# Patient Record
Sex: Male | Born: 1950
Health system: Southern US, Community
[De-identification: ages and names within clinical notes are randomized; demographics above are authoritative.]

## PROBLEM LIST (undated history)

## (undated) DIAGNOSIS — E119 Type 2 diabetes mellitus without complications: Secondary | ICD-10-CM

## (undated) DIAGNOSIS — Z720 Tobacco use: Secondary | ICD-10-CM

## (undated) DIAGNOSIS — F32A Depression, unspecified: Secondary | ICD-10-CM

## (undated) DIAGNOSIS — Z9861 Coronary angioplasty status: Principal | ICD-10-CM

## (undated) DIAGNOSIS — M1990 Unspecified osteoarthritis, unspecified site: Secondary | ICD-10-CM

## (undated) DIAGNOSIS — F329 Major depressive disorder, single episode, unspecified: Secondary | ICD-10-CM

## (undated) DIAGNOSIS — I200 Unstable angina: Secondary | ICD-10-CM

## (undated) DIAGNOSIS — I2510 Atherosclerotic heart disease of native coronary artery without angina pectoris: Principal | ICD-10-CM

## (undated) HISTORY — DX: Depression, unspecified: F32.A

## (undated) HISTORY — DX: Atherosclerotic heart disease of native coronary artery without angina pectoris: I25.10

## (undated) HISTORY — PX: INGUINAL HERNIA REPAIR: SUR1180

## (undated) HISTORY — DX: Coronary angioplasty status: Z98.61

## (undated) HISTORY — DX: Unstable angina: I20.0

## (undated) HISTORY — DX: Type 2 diabetes mellitus without complications: E11.9

## (undated) HISTORY — DX: Major depressive disorder, single episode, unspecified: F32.9

---

## 1978-12-28 HISTORY — PX: EXCISIONAL HEMORRHOIDECTOMY: SHX1541

## 2001-08-12 ENCOUNTER — Inpatient Hospital Stay (HOSPITAL_COMMUNITY): Admission: EM | Admit: 2001-08-12 | Discharge: 2001-08-16 | Payer: Self-pay | Admitting: Psychiatry

## 2009-01-16 ENCOUNTER — Ambulatory Visit (HOSPITAL_COMMUNITY): Admission: AD | Admit: 2009-01-16 | Discharge: 2009-01-17 | Payer: Self-pay | Admitting: General Surgery

## 2010-08-02 LAB — BASIC METABOLIC PANEL
CO2: 29 mEq/L (ref 19–32)
Chloride: 103 mEq/L (ref 96–112)
Creatinine, Ser: 0.99 mg/dL (ref 0.4–1.5)
GFR calc Af Amer: >60 mL/min (ref >60)
Potassium: 4.2 mEq/L (ref 3.5–5.1)
Sodium: 140 mEq/L (ref 135–145)

## 2010-08-02 LAB — CBC
HCT: 45 % (ref 39.0–52.0)
Hemoglobin: 15.1 g/dL (ref 13.0–17.0)
MCHC: 33.7 g/dL (ref 30.0–36.0)
MCV: 95.4 fL (ref 78.0–100.0)
RBC: 4.72 MIL/uL (ref 4.22–5.81)
WBC: 9.8 10*3/uL (ref 4.0–10.5)

## 2010-09-13 NOTE — Discharge Summary (Signed)
NAME:  Tony Lynch, Tony Lynch                          ACCOUNT NO.:  [id number]   MEDICAL RECORD NO.:  [id number]                  PATIENT TYPE:   LOCATION:                                       FACILITY:   PHYSICIAN:  [facility]                                  DATE OF BIRTH:   DATE OF ADMISSION:  DATE OF DISCHARGE:                                 DISCHARGE SUMMARY   HISTORY OF PRESENT ILLNESS:  The patient is a 60 year old white male who was  admitted voluntarily to mental health inpatient unit after overdose on  benzodiazepines.  The patient was taken to the emergency room after he  called EMS.  He denied previous suicidal behavior.  He felt that his actions  were impulsive and related to medical conflicts.   MEDICAL PROBLEMS:  The patient denies at the time of admission.  He denies  any previous history of depression.   HOSPITAL COURSE:  While in the hospital, patient was initially seen by Dr.  Kathrynn Running.  Later in oncall situation was followed by Dr. Milford Cage.  I  also had the chance to see patient on August 16, 2001 in absence of Dr.  Kathrynn Running.  On August 16, 2001, he denied suicidal ideation, felt that he will  try to work out things with his wife but also felt that he could be in peace  if wife decided to divorce him.  He felt that he has good sustaining support  from his children and sisters.  He denies any suicidal ideation or history  of suicidal behavior.   MENTAL STATUS EXAM:  Bright affect.  Good insight and judgment.  Normal  mood.  Absence of psychosis.  Alert, oriented x 3.  Good memory.  The  patient wanted to go home and family was supporting the notion of discharge.  He was able to contract for safety.   MEDICAL PROBLEMS:  During the brief hospital stay, patient did not have any  medical problems.   LABORATORY DATA:  Blood work, done on the unit, showed borderline elevation  of T3 38.9 with low TSH 0.19 for further observation.  Urinalysis was  normal.  CBC was  normal.   PHYSICAL EXAMINATION:  Vital signs, throughout the hospitalization, were  normal with blood pressure 120/67, normal pulse, respiration rate and  temperature.   DISCHARGE DIAGNOSES:   AXIS I:  Adjustment disorder with depressed mood, improved.   AXIS II:  No diagnosis.   AXIS III:  Status post overdose on benzodiazepines.   AXIS IV:  Moderate stressors (marital difficulties).   AXIS V:  Global Assessment of Functioning upon admission 50; for past year  maximum 78; upon discharge 70.   DISCHARGE MEDICATIONS:  The patient was not interested in starting on any  antidepressant medication.   FOLLOW UP:  He  promised to come to the emergency room if any exacerbation of  symptoms.  The patient knows Dr. Jennelle Human and he promised to call if he needed  psychiatric help.  In the meantime, he agrees for appointment with  counselor, Abel Presto, on August 24, 2001 at 10 a.m.   CONDITION ON DISCHARGE:  He was discharged in good condition home and  discharged in care of his family.     Hipolito Bayley, MD                       [facility]    JS/MEDQ  D:  12/22/2001  T:  12/25/2001  Job:  [phone]

## 2010-09-13 NOTE — Discharge Summary (Signed)
[facility]  Patient:    Tony Lynch, Tony Lynch Visit Number: [id number] MRN: [id number]          Service Type: PSY Location: 300 [id number] [id number] Attending Physician:  Jeanice Lim Dictated by:   Jeanice Lim, M.D. Admit Date:  08/12/2001 Discharge Date: 08/16/2001                             Discharge Summary  NO DICTATION.  Dr. Farrel Gordon dictation. Dictated by:   Jeanice Lim, M.D. Attending Physician:  Jeanice Lim DD:  10/06/01 TD:  10/10/01 Job: [id number]

## 2011-03-07 ENCOUNTER — Ambulatory Visit: Payer: Self-pay

## 2011-07-28 ENCOUNTER — Ambulatory Visit: Payer: Self-pay

## 2011-07-28 ENCOUNTER — Other Ambulatory Visit: Payer: Self-pay | Admitting: Lab

## 2012-04-15 ENCOUNTER — Other Ambulatory Visit: Payer: Self-pay | Admitting: Lab

## 2012-04-15 ENCOUNTER — Ambulatory Visit: Payer: Self-pay

## 2013-05-05 ENCOUNTER — Ambulatory Visit: Payer: No Typology Code available for payment source | Attending: Internal Medicine | Admitting: Internal Medicine

## 2013-05-05 ENCOUNTER — Other Ambulatory Visit: Payer: Self-pay | Admitting: Internal Medicine

## 2013-05-05 VITALS — BP 150/79 | HR 66 | Temp 98.7°F | Resp 14 | Ht 73.0 in | Wt 185.2 lb

## 2013-05-05 DIAGNOSIS — F3289 Other specified depressive episodes: Secondary | ICD-10-CM | POA: Insufficient documentation

## 2013-05-05 DIAGNOSIS — M545 Low back pain, unspecified: Secondary | ICD-10-CM | POA: Insufficient documentation

## 2013-05-05 DIAGNOSIS — F1911 Other psychoactive substance abuse, in remission: Secondary | ICD-10-CM

## 2013-05-05 DIAGNOSIS — M25559 Pain in unspecified hip: Secondary | ICD-10-CM | POA: Insufficient documentation

## 2013-05-05 DIAGNOSIS — F329 Major depressive disorder, single episode, unspecified: Secondary | ICD-10-CM | POA: Insufficient documentation

## 2013-05-05 DIAGNOSIS — M25519 Pain in unspecified shoulder: Secondary | ICD-10-CM | POA: Insufficient documentation

## 2013-05-05 LAB — POCT GLYCOSYLATED HEMOGLOBIN (HGB A1C): Hemoglobin A1C: 7.1

## 2013-05-05 MED ORDER — CYCLOBENZAPRINE HCL 5 MG PO TABS: 5.0000 mg | ORAL_TABLET | Freq: Three times a day (TID) | ORAL | Status: DC | PRN

## 2013-05-05 MED ORDER — ESCITALOPRAM OXALATE 10 MG PO TABS: 10.0000 mg | ORAL_TABLET | Freq: Every day | ORAL | Status: DC

## 2013-05-05 MED ORDER — TRAMADOL HCL 50 MG PO TABS: 50.0000 mg | ORAL_TABLET | Freq: Three times a day (TID) | ORAL | Status: DC | PRN

## 2013-05-05 NOTE — Progress Notes (Signed)
Pt is here to establish care. Had a fall last year. Fell on Lt side and hip,ribs, and shoulders. Hard to sleep.

## 2013-05-05 NOTE — Progress Notes (Signed)
Patient ID: Tony Lynch, male   DOB: August 21, 1950, 63 y.o.   MRN: [id number]   CC:  HPI: 63 year old male presents to the clinic to establish care. His main complaint is pain in his left shoulder, left hip, left rib, lumbar spine. The patient's left-sided pain was exacerbated after falling on the ice last year. The patient has not had any imaging studies done. He has been taking over-the-counter Tylenol and BC powder. He continues to experience lumbar spinal pain for the last 15 years. He states that he is ruptured disc. Had seen Dr. Andria Meuse neurosurgeon 15 years ago. The patient also admits to being depressed and was on Lexapro at one point  Social history denies any suicidal ideation today Smokes 1 pack a day Denies use of alcohol  Family history Reviewed and negative  Not on File Past Medical History  Diagnosis Date  . Depression    No current outpatient prescriptions on file prior to visit.   No current facility-administered medications on file prior to visit.   Family History  Problem Relation Age of Onset  . Diabetes Sister   . Heart disease Sister    History   Social History  . Marital Status: Single    Spouse Name: N/A    Number of Children: N/A  . Years of Education: N/A   Occupational History  . Not on file.   Social History Main Topics  . Smoking status: Current Some Day Smoker    Types: Cigarettes  . Smokeless tobacco: Not on file  . Alcohol Use: No  . Drug Use: No  . Sexual Activity: Not on file   Other Topics Concern  . Not on file   Social History Narrative  . No narrative on file    Review of Systems  Constitutional: Negative for fever, chills, diaphoresis, activity change, appetite change and fatigue.  HENT: Negative for ear pain, nosebleeds, congestion, facial swelling, rhinorrhea, neck pain, neck stiffness and ear discharge.   Eyes: Negative for pain, discharge, redness, itching and visual disturbance.  Respiratory: Negative for cough,  choking, chest tightness, shortness of breath, wheezing and stridor.   Cardiovascular: Negative for chest pain, palpitations and leg swelling.  Gastrointestinal: Negative for abdominal distention.  Genitourinary: Negative for dysuria, urgency, frequency, hematuria, flank pain, decreased urine volume, difficulty urinating and dyspareunia.  Musculoskeletal: As in history of present illness  Neurological: Negative for dizziness, tremors, seizures, syncope, facial asymmetry, speech difficulty, weakness, light-headedness, numbness and headaches.  Hematological: Negative for adenopathy. Does not bruise/bleed easily.  Psychiatric/Behavioral: Negative for hallucinations, behavioral problems, confusion, dysphoric mood, decreased concentration and agitation.    Objective:   Filed Vitals:   05/05/13 1610  BP: 150/79  Pulse: 66  Temp: 98.7 F (37.1 C)  Resp: 14    Physical Exam  Constitutional: Appears well-developed and well-nourished. No distress.  HENT: Normocephalic. External right and left ear normal. Oropharynx is clear and moist.  Eyes: Conjunctivae and EOM are normal. PERRLA, no scleral icterus.  Neck: Normal ROM. Neck supple. No JVD. No tracheal deviation. No thyromegaly.  CVS: RRR, S1/S2 +, no murmurs, no gallops, no carotid bruit.  Pulmonary: Effort and breath sounds normal, no stridor, rhonchi, wheezes, rales.  Abdominal: Soft. BS +,  no distension, tenderness, rebound or guarding.  Musculoskeletal: Normal range of motion. No edema and no tenderness.  Lymphadenopathy: No lymphadenopathy noted, cervical, inguinal. Neuro: Alert. Normal reflexes, muscle tone coordination. No cranial nerve deficit. Skin: Skin is warm and dry. No rash noted. Not  diaphoretic. No erythema. No pallor.  Psychiatric: Normal mood and affect. Behavior, judgment, thought content normal.   Lab Results  Component Value Date   WBC 9.8 01/16/2009   HGB 15.1 01/16/2009   HCT 45.0 01/16/2009   MCV 95.4 01/16/2009    PLT 314 01/16/2009   Lab Results  Component Value Date   CREATININE 0.99 01/16/2009   BUN 7 01/16/2009   NA 140 01/16/2009   K 4.2 01/16/2009   CL 103 01/16/2009   CO2 29 01/16/2009    No results found for this basename: HGBA1C   Lipid Panel  No results found for this basename: chol, trig, hdl, cholhdl, vldl, ldlcalc       Assessment and plan:   There are no active problems to display for this patient.      Lumbar spinal pain Routine MRI of the back Tramadol and Flexeril prescribed   Left-sided shoulder pain and left hip pain Obtain x-rays of the left shoulder, left hip, left ribs Orthopedic referral is in physical therapy referral  Depression Prescribed Lexapro Psychiatry referral   The patient was given clear instructions to go to ER or return to medical center if symptoms don't improve, worsen or new problems develop. The patient verbalized understanding. The patient was told to call to get any lab results if not heard anything in the next week.

## 2013-05-06 ENCOUNTER — Telehealth: Payer: Self-pay | Admitting: *Deleted

## 2013-05-06 LAB — CBC WITH DIFFERENTIAL/PLATELET
BASOS ABS: 0 10*3/uL (ref 0.0–0.1)
BASOS PCT: 0 % (ref 0–1)
EOS ABS: 0.1 10*3/uL (ref 0.0–0.7)
EOS PCT: 1 % (ref 0–5)
HCT: 46 % (ref 39.0–52.0)
Hemoglobin: 16.1 g/dL (ref 13.0–17.0)
LYMPHS PCT: 47 % — AB (ref 12–46)
Lymphs Abs: 4.9 10*3/uL — ABNORMAL HIGH (ref 0.7–4.0)
MCH: 30.8 pg (ref 26.0–34.0)
MCHC: 35 g/dL (ref 30.0–36.0)
MCV: 88 fL (ref 78.0–100.0)
MONO ABS: 0.6 10*3/uL (ref 0.1–1.0)
Monocytes Relative: 6 % (ref 3–12)
Neutro Abs: 4.8 10*3/uL (ref 1.7–7.7)
Neutrophils Relative %: 46 % (ref 43–77)
PLATELETS: 304 10*3/uL (ref 150–400)
RBC: 5.23 MIL/uL (ref 4.22–5.81)
RDW: 13.7 % (ref 11.5–15.5)
WBC: 10.6 10*3/uL — AB (ref 4.0–10.5)

## 2013-05-06 LAB — LIPID PANEL
Cholesterol: 152 mg/dL (ref 0–200)
HDL: 30 mg/dL — AB (ref >39)
LDL Cholesterol: 98 mg/dL (ref 0–99)
TRIGLYCERIDES: 118 mg/dL (ref <150)
Total CHOL/HDL Ratio: 5.1 Ratio
VLDL: 24 mg/dL (ref 0–40)

## 2013-05-06 LAB — DRUG SCREEN, URINE, NO CONFIRMATION
Amphetamine Screen, Ur: NEGATIVE
BARBITURATE QUANT UR: NEGATIVE
Benzodiazepines.: NEGATIVE
COCAINE METABOLITES: NEGATIVE
CREATININE, U: 101.6 mg/dL
METHADONE: NEGATIVE
Marijuana Metabolite: NEGATIVE
OPIATE SCREEN, URINE: NEGATIVE
PROPOXYPHENE: NEGATIVE
Phencyclidine (PCP): NEGATIVE

## 2013-05-06 LAB — COMPLETE METABOLIC PANEL WITH GFR
ALBUMIN: 4.1 g/dL (ref 3.5–5.2)
ALT: <8 U/L (ref 0–53)
AST: 12 U/L (ref 0–37)
Alkaline Phosphatase: 56 U/L (ref 39–117)
BUN: 7 mg/dL (ref 6–23)
CALCIUM: 9.5 mg/dL (ref 8.4–10.5)
CHLORIDE: 102 meq/L (ref 96–112)
CO2: 29 mEq/L (ref 19–32)
Creat: 0.85 mg/dL (ref 0.50–1.35)
GFR, Est African American: >89 mL/min
GFR, Est Non African American: >89 mL/min
GLUCOSE: 119 mg/dL — AB (ref 70–99)
POTASSIUM: 4 meq/L (ref 3.5–5.3)
SODIUM: 140 meq/L (ref 135–145)
TOTAL PROTEIN: 6.6 g/dL (ref 6.0–8.3)
Total Bilirubin: 0.4 mg/dL (ref 0.3–1.2)

## 2013-05-06 LAB — TSH: TSH: 1.193 u[IU]/mL (ref 0.350–4.500)

## 2013-05-06 MED ORDER — METFORMIN HCL 500 MG PO TABS: 500.0000 mg | ORAL_TABLET | Freq: Two times a day (BID) | ORAL | Status: DC

## 2013-05-06 NOTE — Telephone Encounter (Signed)
Message copied by Amalio Loe, [place] on Fri May 06, 2013  2:55 PM ------      Message from: Susie CassetteABROL MD, [facility]      Created: Fri May 06, 2013  2:43 PM       Notify patient that he is a diabetic with an A1c of 7.1. His: The prescription for metformin 500 mg by mouth twice a day, 60 tablets with 2 refills. He should follow up with us in 2 months. ------

## 2013-05-06 NOTE — Telephone Encounter (Signed)
Left a voicemail for pt to give us a call back. 

## 2013-05-09 ENCOUNTER — Telehealth: Payer: Self-pay | Admitting: Emergency Medicine

## 2013-05-09 ENCOUNTER — Telehealth: Payer: Self-pay | Admitting: *Deleted

## 2013-05-09 NOTE — Telephone Encounter (Signed)
Tried contacting pt back. Left a voicemail for pt to give us a call back.

## 2013-05-09 NOTE — Telephone Encounter (Signed)
Pt called and left a message to be called back.

## 2013-05-09 NOTE — Telephone Encounter (Signed)
Pt called back in. Gave pt his lab results.

## 2013-05-18 ENCOUNTER — Ambulatory Visit (HOSPITAL_COMMUNITY)
Admission: RE | Admit: 2013-05-18 | Discharge: 2013-05-18 | Disposition: A | Discharge status: Home / Self Care | Payer: No Typology Code available for payment source | Source: Ambulatory Visit | Attending: Internal Medicine | Admitting: Internal Medicine

## 2013-05-18 ENCOUNTER — Other Ambulatory Visit: Payer: Self-pay | Admitting: Internal Medicine

## 2013-05-18 DIAGNOSIS — M47817 Spondylosis without myelopathy or radiculopathy, lumbosacral region: Secondary | ICD-10-CM | POA: Insufficient documentation

## 2013-05-18 DIAGNOSIS — M5126 Other intervertebral disc displacement, lumbar region: Secondary | ICD-10-CM | POA: Insufficient documentation

## 2013-05-18 DIAGNOSIS — F1911 Other psychoactive substance abuse, in remission: Secondary | ICD-10-CM

## 2013-05-18 DIAGNOSIS — W19XXXA Unspecified fall, initial encounter: Secondary | ICD-10-CM | POA: Insufficient documentation

## 2013-05-18 DIAGNOSIS — M25559 Pain in unspecified hip: Secondary | ICD-10-CM | POA: Insufficient documentation

## 2013-05-18 DIAGNOSIS — M259 Joint disorder, unspecified: Secondary | ICD-10-CM | POA: Insufficient documentation

## 2013-05-18 DIAGNOSIS — R918 Other nonspecific abnormal finding of lung field: Secondary | ICD-10-CM | POA: Insufficient documentation

## 2013-05-18 DIAGNOSIS — G8929 Other chronic pain: Secondary | ICD-10-CM | POA: Insufficient documentation

## 2013-06-01 ENCOUNTER — Ambulatory Visit: Payer: No Typology Code available for payment source | Admitting: Family Medicine

## 2013-06-13 ENCOUNTER — Ambulatory Visit: Payer: No Typology Code available for payment source

## 2013-06-15 ENCOUNTER — Encounter: Payer: Self-pay | Admitting: Family Medicine

## 2013-06-15 ENCOUNTER — Ambulatory Visit (INDEPENDENT_AMBULATORY_CARE_PROVIDER_SITE_OTHER): Payer: No Typology Code available for payment source | Admitting: Family Medicine

## 2013-06-15 VITALS — BP 111/75 | Ht 73.0 in | Wt 185.0 lb

## 2013-06-15 DIAGNOSIS — M25552 Pain in left hip: Secondary | ICD-10-CM

## 2013-06-15 DIAGNOSIS — S32409A Unspecified fracture of unspecified acetabulum, initial encounter for closed fracture: Secondary | ICD-10-CM

## 2013-06-15 DIAGNOSIS — S32402A Unspecified fracture of left acetabulum, initial encounter for closed fracture: Secondary | ICD-10-CM

## 2013-06-15 DIAGNOSIS — M7500 Adhesive capsulitis of unspecified shoulder: Secondary | ICD-10-CM

## 2013-06-15 DIAGNOSIS — M25559 Pain in unspecified hip: Secondary | ICD-10-CM

## 2013-06-15 MED ORDER — METHYLPREDNISOLONE ACETATE 40 MG/ML IJ SUSP
40.0000 mg | Freq: Once | INTRAMUSCULAR | Status: AC
  Administered 2013-06-15: 40 mg via INTRA_ARTICULAR

## 2013-06-15 NOTE — Progress Notes (Signed)
CC: Left shoulder and left hip pain HPI: Patient is a pleasant 63 year old male who presents for the above complaints. He states that they started after he fell when he slipped on some ice about a year ago. He landed on his left side. Since that time he has had left shoulder and left hip pain. He states that he cannot sleep on his left side there is left shoulder pain and cannot reach for objects. He cannot reach behind his back either. He had x-rays of his shoulder that were reportedly negative. He is also complaining of left lateral hip pain that seems to bother him most when he is trying to go to sleep at night. It is mostly at his posterior lateral hip. It does occasionally radiate down his leg to his ankle. He denies any numbness or tingling, weakness, or bowel or bladder symptoms.  ROS: As above in the HPI. All other systems are stable or negative.  PMH: Diabetes  Social: Patient does not smoke or drink alcohol. He is unemployed. Family: Family history is positive for diabetes in his sister  Allergies: No known drug allergies    OBJECTIVE: APPEARANCE:  Patient in no acute distress.The patient appeared well nourished and normally developed. HEENT: No scleral icterus. Conjunctiva non-injected Resp: Non labored Skin: No rash MSK:  Left Shoulder - No swelling or deformity - No TTP over AC joint, biceps tendon, or lateral humerus - Decreased in active flexion, abduction, internal, external rotation - Passive range of motion severely limited in internal and external rotation to approximately 40 of external rotation as compared to 80 on the opposite side. - Strength 5/5 on shoulder abduction, internal rotation, external rotation, empty can with minimal pain - Neurovascularly intact  Left Hip exam:  - No swelling or deformity - Patient has decreased range of motion secondary to pain. There is limited internal and external rotation. - Tenderness to palpation not present over the greater  trochanter or posterior gluteal muscles - Strength is 5 out of 5 in hip flexion and abduction - Neurovascularly intact - FABERs painful with poor range of motion Low back exam: - Full range of motion in flexion, extension, lateral bending, rotation without pain  - No tenderness to palpation over the spinous processes of the lumbar vertebra - No tenderness to palpation at the SI joint or sciatic notch - Negative straight leg raise - Strength 5 out of 5 in the bilateral lower extremities - Reflexes 2+ bilaterally.  MSK US: Not performed Radiographs: X-ray of the left shoulder and left hip were reviewed today as well as MRI of the lumbar spine .   left shoulder x-ray shows a large subacromial spur as well as glenohumeral joint arthritis with spur off of the inferior aspect of the humeral head. Left hip x-ray shows what appears to be a a avulsion fracture off of the acetabular rim superiorly. The back MRI shows no evidence of nerve impingement  ASSESSMENT:  #1. Left frozen shoulder #2. Left hip pain with x-ray findings suspicious for a avulsion from the superior acetabular rim  PLAN:  #1. For frozen shoulder, we will get patient physical therapy. We will also perform a glenohumeral joint injection under ultrasound guidance today for pain relief. See procedure note below. #2. For left hip pain we will obtain a CT scan of the hip to better characterize the likely avulsion fracture. I suspect that management will be nonoperative.  Followup one month.  Consent obtained and verified.  Time-out conducted.  Noted  no overlying erythema, induration, or other signs of local infection.  Skin prepped in a sterile fashion.  Topical analgesic spray: Ethyl chloride.  Joint: Left glenohumeral joint under ultrasound guidance Needle: 22-gauge 1-1/2 inch Completed without difficulty under ultrasound guidance with visualization of the needle entering the glenohumeral joint space and fluid movement into the  appropriate location.  Meds: 4 cc 1% lidocaine, 1 cc depomedrol Advised to call if fevers/chills, erythema, induration, drainage, or persistent bleeding.

## 2013-06-15 NOTE — Patient Instructions (Addendum)
Thank you for coming in today  Refer to physical therapy for frozen shoulder Start shoulder stretches Injection to shoulder today  For hip, we will get CT scan to look for fracture  Followup 1 month  [facility]  06/16/13 at 12:30 pm    If you need to reschedule the appointment please call [phone]

## 2013-06-16 ENCOUNTER — Encounter: Payer: Self-pay | Admitting: Internal Medicine

## 2013-06-16 ENCOUNTER — Ambulatory Visit: Payer: No Typology Code available for payment source | Attending: Internal Medicine | Admitting: Internal Medicine

## 2013-06-16 ENCOUNTER — Ambulatory Visit (HOSPITAL_COMMUNITY)
Admission: RE | Admit: 2013-06-16 | Discharge: 2013-06-16 | Disposition: A | Discharge status: Home / Self Care | Payer: No Typology Code available for payment source | Source: Ambulatory Visit | Attending: Family Medicine | Admitting: Family Medicine

## 2013-06-16 VITALS — BP 144/80 | HR 68 | Temp 98.2°F | Resp 16

## 2013-06-16 DIAGNOSIS — M25552 Pain in left hip: Secondary | ICD-10-CM

## 2013-06-16 DIAGNOSIS — IMO0001 Reserved for inherently not codable concepts without codable children: Secondary | ICD-10-CM

## 2013-06-16 DIAGNOSIS — E119 Type 2 diabetes mellitus without complications: Secondary | ICD-10-CM | POA: Insufficient documentation

## 2013-06-16 DIAGNOSIS — I10 Essential (primary) hypertension: Secondary | ICD-10-CM | POA: Insufficient documentation

## 2013-06-16 DIAGNOSIS — S32402A Unspecified fracture of left acetabulum, initial encounter for closed fracture: Secondary | ICD-10-CM

## 2013-06-16 DIAGNOSIS — R030 Elevated blood-pressure reading, without diagnosis of hypertension: Secondary | ICD-10-CM

## 2013-06-16 DIAGNOSIS — E118 Type 2 diabetes mellitus with unspecified complications: Secondary | ICD-10-CM | POA: Insufficient documentation

## 2013-06-16 DIAGNOSIS — F17200 Nicotine dependence, unspecified, uncomplicated: Secondary | ICD-10-CM | POA: Insufficient documentation

## 2013-06-16 DIAGNOSIS — F3289 Other specified depressive episodes: Secondary | ICD-10-CM

## 2013-06-16 DIAGNOSIS — M169 Osteoarthritis of hip, unspecified: Secondary | ICD-10-CM | POA: Insufficient documentation

## 2013-06-16 DIAGNOSIS — Z87891 Personal history of nicotine dependence: Secondary | ICD-10-CM | POA: Insufficient documentation

## 2013-06-16 DIAGNOSIS — F329 Major depressive disorder, single episode, unspecified: Secondary | ICD-10-CM | POA: Insufficient documentation

## 2013-06-16 DIAGNOSIS — F32A Depression, unspecified: Secondary | ICD-10-CM

## 2013-06-16 DIAGNOSIS — M1610 Unilateral primary osteoarthritis, unspecified hip: Secondary | ICD-10-CM | POA: Insufficient documentation

## 2013-06-16 LAB — GLUCOSE, POCT (MANUAL RESULT ENTRY): POC GLUCOSE: 130 mg/dL — AB (ref 70–99)

## 2013-06-16 MED ORDER — NICOTINE 21 MG/24HR TD PT24: 21.0000 mg | MEDICATED_PATCH | Freq: Every day | TRANSDERMAL | Status: DC

## 2013-06-16 MED ORDER — FREESTYLE SYSTEM KIT
1.0000 | PACK | Status: DC | PRN
Start: 2013-06-16 — End: 2017-12-09

## 2013-06-16 NOTE — Patient Instructions (Signed)
Diabetes Meal Planning Guide The diabetes meal planning guide is a tool to help you plan your meals and snacks. It is important for people with diabetes to manage their blood glucose (sugar) levels. Choosing the right foods and the right amounts throughout your day will help control your blood glucose. Eating right can even help you improve your blood pressure and reach or maintain a healthy weight. CARBOHYDRATE COUNTING MADE EASY When you eat carbohydrates, they turn to sugar. This raises your blood glucose level. Counting carbohydrates can help you control this level so you feel better. When you plan your meals by counting carbohydrates, you can have more flexibility in what you eat and balance your medicine with your food intake. Carbohydrate counting simply means adding up the total amount of carbohydrate grams in your meals and snacks. Try to eat about the same amount at each meal. Foods with carbohydrates are listed below. Each portion below is 1 carbohydrate serving or 15 grams of carbohydrates. Ask your dietician how many grams of carbohydrates you should eat at each meal or snack. Grains and Starches  1 slice bread.   English muffin or hotdog/hamburger bun.   cup cold cereal (unsweetened).   cup cooked pasta or rice.   cup starchy vegetables (corn, potatoes, peas, beans, winter squash).  1 tortilla (6 inches).   bagel.  1 waffle or pancake (size of a CD).   cup cooked cereal.  4 to 6 small crackers. *Whole grain is recommended. Fruit  1 cup fresh unsweetened berries, melon, papaya, pineapple.  1 small fresh fruit.   banana or mango.   cup fruit juice (4 oz unsweetened).   cup canned fruit in natural juice or water.  2 tbs dried fruit.  12 to 15 grapes or cherries. Milk and Yogurt  1 cup fat-free or 1% milk.  1 cup soy milk.  6 oz light yogurt with sugar-free sweetener.  6 oz low-fat soy yogurt.  6 oz plain yogurt. Vegetables  1 cup raw or  cup  cooked is counted as 0 carbohydrates or a "free" food.  If you eat 3 or more servings at 1 meal, count them as 1 carbohydrate serving. Other Carbohydrates   oz chips or pretzels.   cup ice cream or frozen yogurt.   cup sherbet or sorbet.  2 inch square cake, no frosting.  1 tbs honey, sugar, jam, jelly, or syrup.  2 small cookies.  3 squares of graham crackers.  3 cups popcorn.  6 crackers.  1 cup broth-based soup.  Count 1 cup casserole or other mixed foods as 2 carbohydrate servings.  Foods with less than 20 calories in a serving may be counted as 0 carbohydrates or a "free" food. You may want to purchase a book or computer software that lists the carbohydrate gram counts of different foods. In addition, the nutrition facts panel on the labels of the foods you eat are a good source of this information. The label will tell you how big the serving size is and the total number of carbohydrate grams you will be eating per serving. Divide this number by 15 to obtain the number of carbohydrate servings in a portion. Remember, 1 carbohydrate serving equals 15 grams of carbohydrate. SERVING SIZES Measuring foods and serving sizes helps you make sure you are getting the right amount of food. The list below tells how big or small some common serving sizes are.  1 oz.........4 stacked dice.  3 oz.........Deck of cards.  1 tsp........Tip   of little finger.  1 tbs........Thumb.  2 tbs........Golf ball.   cup.......Half of a fist.  1 cup........A fist. SAMPLE DIABETES MEAL PLAN Below is a sample meal plan that includes foods from the grain and starches, dairy, vegetable, fruit, and meat groups. A dietician can individualize a meal plan to fit your calorie needs and tell you the number of servings needed from each food group. However, controlling the total amount of carbohydrates in your meal or snack is more important than making sure you include all of the food groups at every  meal. You may interchange carbohydrate containing foods (dairy, starches, and fruits). The meal plan below is an example of a 2000 calorie diet using carbohydrate counting. This meal plan has 17 carbohydrate servings. Breakfast  1 cup oatmeal (2 carb servings).   cup light yogurt (1 carb serving).  1 cup blueberries (1 carb serving).   cup almonds. Snack  1 large apple (2 carb servings).  1 low-fat string cheese stick. Lunch  Chicken breast salad.  1 cup spinach.   cup chopped tomatoes.  2 oz chicken breast, sliced.  2 tbs low-fat Italian dressing.  12 whole-wheat crackers (2 carb servings).  12 to 15 grapes (1 carb serving).  1 cup low-fat milk (1 carb serving). Snack  1 cup carrots.   cup hummus (1 carb serving). Dinner  3 oz broiled salmon.  1 cup brown rice (3 carb servings). Snack  1  cups steamed broccoli (1 carb serving) drizzled with 1 tsp olive oil and lemon juice.  1 cup light pudding (2 carb servings). DIABETES MEAL PLANNING WORKSHEET Your dietician can use this worksheet to help you decide how many servings of foods and what types of foods are right for you.  BREAKFAST Food Group and Servings / Carb Servings Grain/Starches __________________________________ Dairy __________________________________________ Vegetable ______________________________________ Fruit ___________________________________________ Meat __________________________________________ Fat ____________________________________________ LUNCH Food Group and Servings / Carb Servings Grain/Starches ___________________________________ Dairy ___________________________________________ Fruit ____________________________________________ Meat ___________________________________________ Fat _____________________________________________ DINNER Food Group and Servings / Carb Servings Grain/Starches ___________________________________ Dairy  ___________________________________________ Fruit ____________________________________________ Meat ___________________________________________ Fat _____________________________________________ SNACKS Food Group and Servings / Carb Servings Grain/Starches ___________________________________ Dairy ___________________________________________ Vegetable _______________________________________ Fruit ____________________________________________ Meat ___________________________________________ Fat _____________________________________________ DAILY TOTALS Starches _________________________ Vegetable ________________________ Fruit ____________________________ Dairy ____________________________ Meat ____________________________ Fat ______________________________ Document Released: 01/09/2005 Document Revised: 07/07/2011 Document Reviewed: 11/20/2008 Exit[employer] Patient Information 2014 [employer]. DASH Diet The DASH diet stands for "Dietary Approaches to Stop Hypertension." It is a healthy eating plan that has been shown to reduce high blood pressure (hypertension) in as little as 14 days, while also possibly providing other significant health benefits. These other health benefits include reducing the risk of breast cancer after menopause and reducing the risk of type 2 diabetes, heart disease, colon cancer, and stroke. Health benefits also include weight loss and slowing kidney failure in patients with chronic kidney disease.  DIET GUIDELINES  Limit salt (sodium). Your diet should contain less than 1500 mg of sodium daily.  Limit refined or processed carbohydrates. Your diet should include mostly whole grains. Desserts and added sugars should be used sparingly.  Include small amounts of heart-healthy fats. These types of fats include nuts, oils, and tub margarine. Limit saturated and trans fats. These fats have been shown to be harmful in the body. CHOOSING FOODS  The following food groups  are based on a 2000 calorie diet. See your Registered Dietitian for individual calorie needs. Grains and Grain Products (6 to 8 servings daily)  Eat More Often:   Whole-wheat bread, brown rice, whole-grain or wheat pasta, quinoa, popcorn without added fat or salt (air popped).  Eat Less Often: White bread, white pasta, white rice, cornbread. Vegetables (4 to 5 servings daily)  Eat More Often: Fresh, frozen, and canned vegetables. Vegetables may be raw, steamed, roasted, or grilled with a minimal amount of fat.  Eat Less Often/Avoid: Creamed or fried vegetables. Vegetables in a cheese sauce. Fruit (4 to 5 servings daily)  Eat More Often: All fresh, canned (in natural juice), or frozen fruits. Dried fruits without added sugar. One hundred percent fruit juice ( cup [237 mL] daily).  Eat Less Often: Dried fruits with added sugar. Canned fruit in light or heavy syrup. Lean Meats, Fish, and Poultry (2 servings or less daily. One serving is 3 to 4 oz [85-114 g]).  Eat More Often: Ninety percent or leaner ground beef, tenderloin, sirloin. Round cuts of beef, chicken breast, turkey breast. All fish. Grill, bake, or broil your meat. Nothing should be fried.  Eat Less Often/Avoid: Fatty cuts of meat, turkey, or chicken leg, thigh, or wing. Fried cuts of meat or fish. Dairy (2 to 3 servings)  Eat More Often: Low-fat or fat-free milk, low-fat plain or light yogurt, reduced-fat or part-skim cheese.  Eat Less Often/Avoid: Milk (whole, 2%).Whole milk yogurt. Full-fat cheeses. Nuts, Seeds, and Legumes (4 to 5 servings per week)  Eat More Often: All without added salt.  Eat Less Often/Avoid: Salted nuts and seeds, canned beans with added salt. Fats and Sweets (limited)  Eat More Often: Vegetable oils, tub margarines without trans fats, sugar-free gelatin. Mayonnaise and salad dressings.  Eat Less Often/Avoid: Coconut oils, palm oils, butter, stick margarine, cream, half and half, cookies, candy,  pie. FOR MORE INFORMATION The Dash Diet Eating Plan: www.dashdiet.org Document Released: 04/03/2011 Document Revised: 07/07/2011 Document Reviewed: 04/03/2011 Exit[employer] Patient Information 2014 [employer].  

## 2013-06-16 NOTE — Progress Notes (Signed)
MRN: [id number] Name: Tony Lynch  Sex: male Age: 63 y.o. DOB: 1950/06/17  Allergies: Review of patient's allergies indicates no known allergies.  Chief Complaint  Patient presents with  . Follow-up    HPI: Patient is 63 y.o. male who has history of diabetes depression, tobacco abuse, comes today for followup, patient denies any hypoglycemic symptoms and is taking metformin 500 mg twice a day, patient doesn't have glucometer to check blood sugar at home, today's blood pressure is borderline high, denies any headache dizziness chest and shortness of breath, patient does smoke cigarettes everyday, advised to quit smoking he is agreeable to try nicotine patch.  Past Medical History  Diagnosis Date  . Depression     History reviewed. No pertinent past surgical history.    Medication List       This list is accurate as of: 06/16/13 12:09 PM.  Always use your most recent med list.               cyclobenzaprine 5 MG tablet  Commonly known as:  FLEXERIL  Take 1 tablet (5 mg total) by mouth 3 (three) times daily as needed for muscle spasms.     escitalopram 10 MG tablet  Commonly known as:  LEXAPRO  Take 1 tablet (10 mg total) by mouth daily.     glucose monitoring kit monitoring kit  1 each by Does not apply route as needed for other. Dispense any model that is covered- dispense testing supplies for Q AC/ HS accuchecks- 1 month supply with one refil.     metFORMIN 500 MG tablet  Commonly known as:  GLUCOPHAGE  Take 1 tablet (500 mg total) by mouth 2 (two) times daily with a meal.     nicotine 21 mg/24hr patch  Commonly known as:  NICODERM CQ  Place 1 patch (21 mg total) onto the skin daily.     traMADol 50 MG tablet  Commonly known as:  ULTRAM  Take 1 tablet (50 mg total) by mouth every 8 (eight) hours as needed.        Meds ordered this encounter  Medications  . nicotine (NICODERM CQ) 21 mg/24hr patch    Sig: Place 1 patch (21 mg total) onto the skin daily.      Dispense:  28 patch    Refill:  0  . glucose monitoring kit (FREESTYLE) monitoring kit    Sig: 1 each by Does not apply route as needed for other. Dispense any model that is covered- dispense testing supplies for Q AC/ HS accuchecks- 1 month supply with one refil.    Dispense:  1 each    Refill:  1     There is no immunization history on file for this patient.  Family History  Problem Relation Age of Onset  . Diabetes Sister   . Heart disease Sister     History  Substance Use Topics  . Smoking status: Current Some Day Smoker    Types: Cigarettes  . Smokeless tobacco: Not on file  . Alcohol Use: No    Review of Systems   As noted in HPI  Filed Vitals:   06/16/13 1134  BP: 144/80  Pulse: 68  Temp: 98.2 F (36.8 C)  Resp: 16    Physical Exam  Physical Exam  Constitutional: No distress.  Eyes: EOM are normal. Pupils are equal, round, and reactive to light.  Cardiovascular: Normal rate and regular rhythm.   Pulmonary/Chest: Breath sounds normal. No respiratory  distress. He has no wheezes. He has no rales.  Musculoskeletal: He exhibits no edema.    CBC    Component Value Date/Time   WBC 10.6* 05/05/2013 1634   RBC 5.23 05/05/2013 1634   HGB 16.1 05/05/2013 1634   HCT 46.0 05/05/2013 1634   PLT 304 05/05/2013 1634   MCV 88.0 05/05/2013 1634   LYMPHSABS 4.9* 05/05/2013 1634   MONOABS 0.6 05/05/2013 1634   EOSABS 0.1 05/05/2013 1634   BASOSABS 0.0 05/05/2013 1634    CMP     Component Value Date/Time   NA 140 05/05/2013 1634   K 4.0 05/05/2013 1634   CL 102 05/05/2013 1634   CO2 29 05/05/2013 1634   GLUCOSE 119* 05/05/2013 1634   BUN 7 05/05/2013 1634   CREATININE 0.85 05/05/2013 1634   CREATININE 0.99 01/16/2009 1630   CALCIUM 9.5 05/05/2013 1634   PROT 6.6 05/05/2013 1634   ALBUMIN 4.1 05/05/2013 1634   AST 12 05/05/2013 1634   ALT <8 05/05/2013 1634   ALKPHOS 56 05/05/2013 1634   BILITOT 0.4 05/05/2013 1634   GFRNONAA >60 01/16/2009 1630   GFRAA  Value: >60        The eGFR has been  calculated using the MDRD equation. This calculation has not been validated in all clinical situations. eGFR's persistently <60 mL/min signify possible Chronic Kidney Disease. 01/16/2009 1630    Lab Results  Component Value Date/Time   CHOL 152 05/05/2013  4:34 PM    No components found with this basename: hga1c    Lab Results  Component Value Date/Time   AST 12 05/05/2013  4:34 PM    Assessment and Plan  DM (diabetes mellitus) - Plan: Glucose (CBG), glucose monitoring kit (FREESTYLE) monitoring kit Results for orders placed in visit on 06/16/13  GLUCOSE, POCT (MANUAL RESULT ENTRY)      Result Value Ref Range   POC Glucose 130 (*) 70 - 99 mg/dl   Advised patient to check blood sugar at home, we'll repeat hemoglobin A1c on the next visit  Elevated BP Advised patient for DASH DIET   Smoking - Plan: nicotine (NICODERM CQ) 21 mg/24hr patch  Depression Symptoms stable continue Lexapro   Return in about 3 months (around 09/13/2013).  Lorayne Marek, MD

## 2013-06-16 NOTE — Progress Notes (Signed)
Patient states here for follow up -DM

## 2013-06-20 ENCOUNTER — Ambulatory Visit: Payer: No Typology Code available for payment source | Attending: Family Medicine | Admitting: Physical Therapy

## 2013-06-20 DIAGNOSIS — M7500 Adhesive capsulitis of unspecified shoulder: Secondary | ICD-10-CM | POA: Insufficient documentation

## 2013-06-20 DIAGNOSIS — IMO0001 Reserved for inherently not codable concepts without codable children: Secondary | ICD-10-CM | POA: Insufficient documentation

## 2013-06-20 DIAGNOSIS — M25619 Stiffness of unspecified shoulder, not elsewhere classified: Secondary | ICD-10-CM | POA: Insufficient documentation

## 2013-06-20 DIAGNOSIS — M25519 Pain in unspecified shoulder: Secondary | ICD-10-CM | POA: Insufficient documentation

## 2013-06-20 DIAGNOSIS — R293 Abnormal posture: Secondary | ICD-10-CM | POA: Insufficient documentation

## 2013-06-27 ENCOUNTER — Ambulatory Visit: Payer: No Typology Code available for payment source | Attending: Family Medicine | Admitting: Rehabilitation

## 2013-06-27 DIAGNOSIS — IMO0001 Reserved for inherently not codable concepts without codable children: Secondary | ICD-10-CM | POA: Insufficient documentation

## 2013-06-27 DIAGNOSIS — M25519 Pain in unspecified shoulder: Secondary | ICD-10-CM | POA: Insufficient documentation

## 2013-06-27 DIAGNOSIS — R293 Abnormal posture: Secondary | ICD-10-CM | POA: Insufficient documentation

## 2013-06-27 DIAGNOSIS — M25619 Stiffness of unspecified shoulder, not elsewhere classified: Secondary | ICD-10-CM | POA: Insufficient documentation

## 2013-06-27 DIAGNOSIS — M7500 Adhesive capsulitis of unspecified shoulder: Secondary | ICD-10-CM | POA: Insufficient documentation

## 2013-06-29 ENCOUNTER — Ambulatory Visit: Payer: No Typology Code available for payment source

## 2013-07-04 ENCOUNTER — Ambulatory Visit: Payer: No Typology Code available for payment source | Admitting: Rehabilitation

## 2013-07-06 ENCOUNTER — Ambulatory Visit: Payer: No Typology Code available for payment source | Admitting: Rehabilitation

## 2013-07-11 ENCOUNTER — Ambulatory Visit: Payer: No Typology Code available for payment source | Admitting: Rehabilitation

## 2013-07-12 ENCOUNTER — Encounter: Payer: Self-pay | Admitting: Family Medicine

## 2013-07-12 ENCOUNTER — Ambulatory Visit (INDEPENDENT_AMBULATORY_CARE_PROVIDER_SITE_OTHER): Payer: No Typology Code available for payment source | Admitting: Family Medicine

## 2013-07-12 VITALS — BP 125/66 | Ht 73.0 in | Wt 185.0 lb

## 2013-07-12 DIAGNOSIS — M7500 Adhesive capsulitis of unspecified shoulder: Secondary | ICD-10-CM

## 2013-07-12 MED ORDER — AMITRIPTYLINE HCL 25 MG PO TABS: 25.0000 mg | ORAL_TABLET | Freq: Every day | ORAL | Status: DC

## 2013-07-12 NOTE — Patient Instructions (Signed)
Thank you for coming in today  1. Continue physical therapy 2. Do home exercises daily 3. Try amitriptyline at night for painful frozen shoulder  Followup 2 months

## 2013-07-12 NOTE — Progress Notes (Signed)
CC: Followup left shoulder and left hip HPI: Patient is a pleasant 63 year old male who presents for followup today. 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 He also had persistent left hip pain after a fall on some ice every year ago. Initially there was some concern about a avulsion fracture off his acetabulum so I obtained a CT scan. This is negative for new or old fracture but did show some cystic degenerative change consistent with osteoarthritis. He presents today for followup. He states his left hip is not really bothering him that much. He is tolerating it okay at this point. However, his left shoulder continues to bother him. He has improved since I last saw him. The glenohumeral joint injection helped him about 50%. He is also been in physical therapy which has really helped his range of motion. He is still doing this. He complains that the tramadol does not really help his pain very much.  ROS: As above in the HPI. All other systems are stable or negative.  OBJECTIVE: APPEARANCE:  Patient in no acute distress.The patient appeared well nourished and normally developed. HEENT: No scleral icterus. Conjunctiva non-injected Resp: Non labored Skin: No rash MSK:  Left Shoulder - No swelling or deformity - No TTP over AC joint, biceps tendon, or lateral humerus - Range of motion in forward flexion is approximately 130, 120 abduction, external rotation 60, internal rotation to back pocket - Strength 5/5 on shoulder abduction, internal rotation, external rotation, empty can - Pain with shoulder abduction and internal rotation along with tightness and loss of motion - Neurovascularly intact   MSK US: Not performed   ASSESSMENT: #1. Left frozen shoulder, improving, in a patient with diabetes #2. Left hip pain, improved   PLAN: For his frozen shoulder, I recommended he continue physical therapy. We again discussed the natural course of this illness. I  explained to him that oftentimes there is felt to be a neurogenic etiology of the pain. Therefore recommended that he try amitriptyline 25 mg each bedtime to see if this helps with his pain. He is agreeable to this and we will start this medication today. I did caution him about the side effect of sleepiness recommended he take it at night and be careful about taking the Flexeril with the amitriptyline. He will followup with me in 2 months.

## 2013-07-13 ENCOUNTER — Ambulatory Visit: Payer: No Typology Code available for payment source | Admitting: Rehabilitation

## 2013-07-18 ENCOUNTER — Ambulatory Visit: Payer: No Typology Code available for payment source | Admitting: Rehabilitation

## 2013-07-19 ENCOUNTER — Ambulatory Visit: Payer: No Typology Code available for payment source | Admitting: Rehabilitation

## 2013-07-25 ENCOUNTER — Ambulatory Visit: Payer: No Typology Code available for payment source | Admitting: Rehabilitation

## 2013-07-27 ENCOUNTER — Ambulatory Visit: Payer: No Typology Code available for payment source | Attending: Family Medicine | Admitting: Rehabilitation

## 2013-07-27 DIAGNOSIS — M7500 Adhesive capsulitis of unspecified shoulder: Secondary | ICD-10-CM | POA: Insufficient documentation

## 2013-07-27 DIAGNOSIS — IMO0001 Reserved for inherently not codable concepts without codable children: Secondary | ICD-10-CM | POA: Insufficient documentation

## 2013-07-27 DIAGNOSIS — R293 Abnormal posture: Secondary | ICD-10-CM | POA: Insufficient documentation

## 2013-07-27 DIAGNOSIS — M25619 Stiffness of unspecified shoulder, not elsewhere classified: Secondary | ICD-10-CM | POA: Insufficient documentation

## 2013-07-27 DIAGNOSIS — M25519 Pain in unspecified shoulder: Secondary | ICD-10-CM | POA: Insufficient documentation

## 2013-08-02 ENCOUNTER — Ambulatory Visit: Payer: No Typology Code available for payment source | Admitting: Physical Therapy

## 2013-08-09 ENCOUNTER — Ambulatory Visit: Payer: No Typology Code available for payment source | Admitting: Internal Medicine

## 2013-08-25 ENCOUNTER — Other Ambulatory Visit: Payer: Self-pay | Admitting: Internal Medicine

## 2013-08-25 DIAGNOSIS — E119 Type 2 diabetes mellitus without complications: Secondary | ICD-10-CM

## 2013-10-03 ENCOUNTER — Ambulatory Visit: Payer: No Typology Code available for payment source | Attending: Internal Medicine | Admitting: Internal Medicine

## 2013-10-03 ENCOUNTER — Encounter: Payer: Self-pay | Admitting: Internal Medicine

## 2013-10-03 VITALS — BP 122/64 | HR 69 | Temp 98.2°F | Resp 16 | Wt 183.4 lb

## 2013-10-03 DIAGNOSIS — R229 Localized swelling, mass and lump, unspecified: Secondary | ICD-10-CM | POA: Insufficient documentation

## 2013-10-03 DIAGNOSIS — Z79899 Other long term (current) drug therapy: Secondary | ICD-10-CM | POA: Insufficient documentation

## 2013-10-03 DIAGNOSIS — F3289 Other specified depressive episodes: Secondary | ICD-10-CM | POA: Insufficient documentation

## 2013-10-03 DIAGNOSIS — R222 Localized swelling, mass and lump, trunk: Secondary | ICD-10-CM | POA: Insufficient documentation

## 2013-10-03 DIAGNOSIS — F329 Major depressive disorder, single episode, unspecified: Secondary | ICD-10-CM | POA: Insufficient documentation

## 2013-10-03 DIAGNOSIS — E119 Type 2 diabetes mellitus without complications: Secondary | ICD-10-CM

## 2013-10-03 DIAGNOSIS — R51 Headache: Secondary | ICD-10-CM | POA: Insufficient documentation

## 2013-10-03 DIAGNOSIS — F32A Depression, unspecified: Secondary | ICD-10-CM

## 2013-10-03 DIAGNOSIS — F17200 Nicotine dependence, unspecified, uncomplicated: Secondary | ICD-10-CM | POA: Insufficient documentation

## 2013-10-03 DIAGNOSIS — Z1211 Encounter for screening for malignant neoplasm of colon: Secondary | ICD-10-CM

## 2013-10-03 LAB — GLUCOSE, POCT (MANUAL RESULT ENTRY): POC Glucose: 181 mg/dl — AB (ref 70–99)

## 2013-10-03 LAB — POCT GLYCOSYLATED HEMOGLOBIN (HGB A1C): Hemoglobin A1C: 6.6

## 2013-10-03 NOTE — Progress Notes (Signed)
Patient complains of headaches that started about a month ago  Between six and eight on the pain scale

## 2013-10-03 NOTE — Progress Notes (Signed)
MRN: [id number] Name: Tony Lynch  Sex: male Age: 63 y.o. DOB: 18-Apr-1951  Allergies: Review of patient's allergies indicates no known allergies.  Chief Complaint  Patient presents with  . Headache    HPI: Patient is 64 y.o. male who has is she of diabetes depression comes today for followup, he is compliant with his medication and is taking metformin 500 mg twice a day, denies any hypoglycemic symptoms, his hemoglobin A1c has trended down to 6.6%, patient is to smoke cigarettes, I have advised patient to quit smoking patient has already been prescribed nicotine patch, he reported to have on and off headache, denies any family history of migraine headaches denies any change in vision numbness weakness has occasional nausea, currently denies any more headache, he takes Aleve when necessary. Patient reported to have noticed lump on his upper back for the last 2 years denies any change in size, as per patient  he squeezed it  in the past and some fluid came out  which was foul-smelling.  Past Medical History  Diagnosis Date  . Depression     History reviewed. No pertinent past surgical history.    Medication List       This list is accurate as of: 10/03/13  2:30 PM.  Always use your most recent med list.               amitriptyline 25 MG tablet  Commonly known as:  ELAVIL  Take 1 tablet (25 mg total) by mouth at bedtime.     cyclobenzaprine 5 MG tablet  Commonly known as:  FLEXERIL  Take 1 tablet (5 mg total) by mouth 3 (three) times daily as needed for muscle spasms.     escitalopram 10 MG tablet  Commonly known as:  LEXAPRO  Take 1 tablet (10 mg total) by mouth daily.     glucose monitoring kit monitoring kit  1 each by Does not apply route as needed for other. Dispense any model that is covered- dispense testing supplies for Q AC/ HS accuchecks- 1 month supply with one refil.     metFORMIN 500 MG tablet  Commonly known as:  GLUCOPHAGE  TAKE 1 TABLET BY MOUTH TWICE  A DAY WITH A MEAL     nicotine 21 mg/24hr patch  Commonly known as:  NICODERM CQ  Place 1 patch (21 mg total) onto the skin daily.     traMADol 50 MG tablet  Commonly known as:  ULTRAM  Take 1 tablet (50 mg total) by mouth every 8 (eight) hours as needed.        No orders of the defined types were placed in this encounter.     There is no immunization history on file for this patient.  Family History  Problem Relation Age of Onset  . Diabetes Sister   . Heart disease Sister     History  Substance Use Topics  . Smoking status: Current Some Day Smoker    Types: Cigarettes  . Smokeless tobacco: Not on file  . Alcohol Use: No    Review of Systems   As noted in HPI  Filed Vitals:   10/03/13 1413  BP: 122/64  Pulse: 69  Temp: 98.2 F (36.8 C)  Resp: 16    Physical Exam  Physical Exam  Constitutional: He is oriented to person, place, and time. No distress.  Eyes: EOM are normal. Pupils are equal, round, and reactive to light.  Cardiovascular: Normal rate and regular rhythm.  Pulmonary/Chest: Breath sounds normal. No respiratory distress. He has no wheezes. He has no rales.  Neurological: He is alert and oriented to person, place, and time. No cranial nerve deficit.  Skin:  Upper back localized non tender lump mobile non tender, no erythema, no signs of infection     CBC    Component Value Date/Time   WBC 10.6* 05/05/2013 1634   RBC 5.23 05/05/2013 1634   HGB 16.1 05/05/2013 1634   HCT 46.0 05/05/2013 1634   PLT 304 05/05/2013 1634   MCV 88.0 05/05/2013 1634   LYMPHSABS 4.9* 05/05/2013 1634   MONOABS 0.6 05/05/2013 1634   EOSABS 0.1 05/05/2013 1634   BASOSABS 0.0 05/05/2013 1634    CMP     Component Value Date/Time   NA 140 05/05/2013 1634   K 4.0 05/05/2013 1634   CL 102 05/05/2013 1634   CO2 29 05/05/2013 1634   GLUCOSE 119* 05/05/2013 1634   BUN 7 05/05/2013 1634   CREATININE 0.85 05/05/2013 1634   CREATININE 0.99 01/16/2009 1630   CALCIUM 9.5 05/05/2013 1634   PROT 6.6  05/05/2013 1634   ALBUMIN 4.1 05/05/2013 1634   AST 12 05/05/2013 1634   ALT <8 05/05/2013 1634   ALKPHOS 56 05/05/2013 1634   BILITOT 0.4 05/05/2013 1634   GFRNONAA >89 05/05/2013 1634   GFRNONAA >60 01/16/2009 1630   GFRAA >89 05/05/2013 1634   GFRAA  Value: >60        The eGFR has been calculated using the MDRD equation. This calculation has not been validated in all clinical situations. eGFR's persistently <60 mL/min signify possible Chronic Kidney Disease. 01/16/2009 1630    Lab Results  Component Value Date/Time   CHOL 152 05/05/2013  4:34 PM    No components found with this basename: hga1c    Lab Results  Component Value Date/Time   AST 12 05/05/2013  4:34 PM    Assessment and Plan  DM (diabetes mellitus) - Plan:  Results for orders placed in visit on 10/03/13  GLUCOSE, POCT (MANUAL RESULT ENTRY)      Result Value Ref Range   POC Glucose 181 (*) 70 - 99 mg/dl  POCT GLYCOSYLATED HEMOGLOBIN (HGB A1C)      Result Value Ref Range   Hemoglobin A1C 6.6     Diabetes is well controlled continue with metformin.  Smoking Advised patient to quit smoking   Depression Patient is on Lexapro symptoms are stable.  Lump of skin of back - Plan: Ambulatory referral to Dermatology  Special screening for malignant neoplasms, colon - Plan: Ambulatory referral to Gastroenterology  Headache(784.0) Occasional headaches, Tylenol/Aleve when necessary.   Health Maintenance -Colonoscopy: referred to GI   Return in about 3 months (around 01/03/2014) for diabetes.  Lorayne Marek, MD

## 2013-11-20 ENCOUNTER — Other Ambulatory Visit: Payer: Self-pay | Admitting: Internal Medicine

## 2013-11-20 DIAGNOSIS — E119 Type 2 diabetes mellitus without complications: Secondary | ICD-10-CM

## 2014-02-10 ENCOUNTER — Other Ambulatory Visit: Payer: Self-pay

## 2014-02-22 ENCOUNTER — Other Ambulatory Visit: Payer: Self-pay | Admitting: Internal Medicine

## 2014-03-28 ENCOUNTER — Other Ambulatory Visit: Payer: Self-pay

## 2014-03-28 ENCOUNTER — Inpatient Hospital Stay (HOSPITAL_COMMUNITY)
Admission: EM | Admit: 2014-03-28 | Discharge: 2014-03-29 | DRG: 247 | Disposition: A | Discharge status: Home / Self Care | Payer: Self-pay | Attending: Cardiology | Admitting: Cardiology

## 2014-03-28 ENCOUNTER — Emergency Department (HOSPITAL_COMMUNITY): Payer: Self-pay

## 2014-03-28 ENCOUNTER — Encounter (HOSPITAL_COMMUNITY)
Admission: EM | Disposition: A | Discharge status: Home / Self Care | Payer: Self-pay | Source: Home / Self Care | Attending: Cardiology

## 2014-03-28 ENCOUNTER — Encounter (HOSPITAL_COMMUNITY): Payer: Self-pay

## 2014-03-28 DIAGNOSIS — I200 Unstable angina: Secondary | ICD-10-CM

## 2014-03-28 DIAGNOSIS — E119 Type 2 diabetes mellitus without complications: Secondary | ICD-10-CM

## 2014-03-28 DIAGNOSIS — Z9861 Coronary angioplasty status: Secondary | ICD-10-CM

## 2014-03-28 DIAGNOSIS — I214 Non-ST elevation (NSTEMI) myocardial infarction: Principal | ICD-10-CM | POA: Diagnosis present

## 2014-03-28 DIAGNOSIS — M1610 Unilateral primary osteoarthritis, unspecified hip: Secondary | ICD-10-CM | POA: Diagnosis present

## 2014-03-28 DIAGNOSIS — Z8249 Family history of ischemic heart disease and other diseases of the circulatory system: Secondary | ICD-10-CM

## 2014-03-28 DIAGNOSIS — E118 Type 2 diabetes mellitus with unspecified complications: Secondary | ICD-10-CM | POA: Diagnosis present

## 2014-03-28 DIAGNOSIS — F32A Depression, unspecified: Secondary | ICD-10-CM | POA: Diagnosis present

## 2014-03-28 DIAGNOSIS — M1990 Unspecified osteoarthritis, unspecified site: Secondary | ICD-10-CM | POA: Diagnosis present

## 2014-03-28 DIAGNOSIS — M479 Spondylosis, unspecified: Secondary | ICD-10-CM | POA: Diagnosis present

## 2014-03-28 DIAGNOSIS — I2510 Atherosclerotic heart disease of native coronary artery without angina pectoris: Secondary | ICD-10-CM

## 2014-03-28 DIAGNOSIS — Z79899 Other long term (current) drug therapy: Secondary | ICD-10-CM

## 2014-03-28 DIAGNOSIS — F17210 Nicotine dependence, cigarettes, uncomplicated: Secondary | ICD-10-CM | POA: Diagnosis present

## 2014-03-28 DIAGNOSIS — M159 Polyosteoarthritis, unspecified: Secondary | ICD-10-CM

## 2014-03-28 DIAGNOSIS — E785 Hyperlipidemia, unspecified: Secondary | ICD-10-CM | POA: Diagnosis present

## 2014-03-28 DIAGNOSIS — F329 Major depressive disorder, single episode, unspecified: Secondary | ICD-10-CM | POA: Diagnosis present

## 2014-03-28 DIAGNOSIS — Z720 Tobacco use: Secondary | ICD-10-CM

## 2014-03-28 DIAGNOSIS — R0989 Other specified symptoms and signs involving the circulatory and respiratory systems: Secondary | ICD-10-CM

## 2014-03-28 DIAGNOSIS — R0789 Other chest pain: Secondary | ICD-10-CM

## 2014-03-28 DIAGNOSIS — Z955 Presence of coronary angioplasty implant and graft: Secondary | ICD-10-CM

## 2014-03-28 DIAGNOSIS — R001 Bradycardia, unspecified: Secondary | ICD-10-CM | POA: Diagnosis present

## 2014-03-28 DIAGNOSIS — I739 Peripheral vascular disease, unspecified: Secondary | ICD-10-CM | POA: Diagnosis present

## 2014-03-28 DIAGNOSIS — Z87891 Personal history of nicotine dependence: Secondary | ICD-10-CM | POA: Diagnosis present

## 2014-03-28 HISTORY — DX: Tobacco use: Z72.0

## 2014-03-28 HISTORY — DX: Coronary angioplasty status: Z98.61

## 2014-03-28 HISTORY — DX: Unspecified osteoarthritis, unspecified site: M19.90

## 2014-03-28 HISTORY — PX: PERCUTANEOUS CORONARY STENT INTERVENTION (PCI-S): SHX5485

## 2014-03-28 HISTORY — PX: LEFT HEART CATHETERIZATION WITH CORONARY ANGIOGRAM: SHX5451

## 2014-03-28 HISTORY — DX: Unstable angina: I20.0

## 2014-03-28 HISTORY — DX: Atherosclerotic heart disease of native coronary artery without angina pectoris: I25.10

## 2014-03-28 LAB — BASIC METABOLIC PANEL
Anion gap: 13 (ref 5–15)
BUN: 9 mg/dL (ref 6–23)
CALCIUM: 9.3 mg/dL (ref 8.4–10.5)
CO2: 26 mEq/L (ref 19–32)
CREATININE: 0.94 mg/dL (ref 0.50–1.35)
Chloride: 100 mEq/L (ref 96–112)
GFR calc Af Amer: >90 mL/min (ref >90)
GFR calc non Af Amer: 88 mL/min — ABNORMAL LOW (ref >90)
GLUCOSE: 154 mg/dL — AB (ref 70–99)
Potassium: 4.9 mEq/L (ref 3.7–5.3)
SODIUM: 139 meq/L (ref 137–147)

## 2014-03-28 LAB — CBC
HCT: 45.5 % (ref 39.0–52.0)
HEMOGLOBIN: 15.6 g/dL (ref 13.0–17.0)
MCH: 30.6 pg (ref 26.0–34.0)
MCHC: 34.3 g/dL (ref 30.0–36.0)
MCV: 89.2 fL (ref 78.0–100.0)
Platelets: 259 10*3/uL (ref 150–400)
RBC: 5.1 MIL/uL (ref 4.22–5.81)
RDW: 13.3 % (ref 11.5–15.5)
WBC: 8.5 10*3/uL (ref 4.0–10.5)

## 2014-03-28 LAB — TSH: TSH: 1.1 u[IU]/mL (ref 0.350–4.500)

## 2014-03-28 LAB — HEPATIC FUNCTION PANEL
ALT: 10 U/L (ref 0–53)
AST: 29 U/L (ref 0–37)
Albumin: 3.6 g/dL (ref 3.5–5.2)
Alkaline Phosphatase: 50 U/L (ref 39–117)
BILIRUBIN TOTAL: 0.3 mg/dL (ref 0.3–1.2)
Total Protein: 6.8 g/dL (ref 6.0–8.3)

## 2014-03-28 LAB — GLUCOSE, CAPILLARY
GLUCOSE-CAPILLARY: 92 mg/dL (ref 70–99)
GLUCOSE-CAPILLARY: 200 mg/dL — AB (ref 70–99)
GLUCOSE-CAPILLARY: 132 mg/dL — AB (ref 70–99)

## 2014-03-28 LAB — LIPID PANEL
Cholesterol: 161 mg/dL (ref 0–200)
HDL: 31 mg/dL — AB (ref >39)
LDL CALC: 103 mg/dL — AB (ref 0–99)
Total CHOL/HDL Ratio: 5.2 RATIO
Triglycerides: 135 mg/dL (ref <150)
VLDL: 27 mg/dL (ref 0–40)

## 2014-03-28 LAB — PROTIME-INR
INR: 1.08 (ref 0.00–1.49)
Prothrombin Time: 14.1 seconds (ref 11.6–15.2)

## 2014-03-28 LAB — MAGNESIUM: Magnesium: 2.1 mg/dL (ref 1.5–2.5)

## 2014-03-28 LAB — TROPONIN I
Troponin I: <0.3 ng/mL (ref <0.30)
Troponin I: 2.22 ng/mL (ref <0.30)

## 2014-03-28 LAB — HEMOGLOBIN A1C
HEMOGLOBIN A1C: 6.9 % — AB (ref <5.7)
Mean Plasma Glucose: 151 mg/dL — ABNORMAL HIGH (ref <117)

## 2014-03-28 LAB — POCT ACTIVATED CLOTTING TIME: Activated Clotting Time: 478 seconds

## 2014-03-28 LAB — I-STAT TROPONIN, ED: TROPONIN I, POC: 0.07 ng/mL (ref 0.00–0.08)

## 2014-03-28 SURGERY — LEFT HEART CATHETERIZATION WITH CORONARY ANGIOGRAM
Anesthesia: LOCAL

## 2014-03-28 MED ORDER — PRASUGREL HCL 10 MG PO TABS
10.0000 mg | ORAL_TABLET | Freq: Every day | ORAL | Status: DC
  Administered 2014-03-29: 11:00:00 10 mg via ORAL
  Filled 2014-03-28: qty 1

## 2014-03-28 MED ORDER — MORPHINE SULFATE 2 MG/ML IJ SOLN: 2.0000 mg | INTRAMUSCULAR | Status: DC | PRN

## 2014-03-28 MED ORDER — PNEUMOCOCCAL VAC POLYVALENT 25 MCG/0.5ML IJ INJ
0.5000 mL | INJECTION | INTRAMUSCULAR | Status: AC
  Administered 2014-03-29: 12:00:00 0.5 mL via INTRAMUSCULAR
  Filled 2014-03-28 (×3): qty 0.5

## 2014-03-28 MED ORDER — ASPIRIN 81 MG PO CHEW: 81.0000 mg | CHEWABLE_TABLET | Freq: Every day | ORAL | Status: DC

## 2014-03-28 MED ORDER — NITROGLYCERIN 0.4 MG SL SUBL: 0.4000 mg | SUBLINGUAL_TABLET | SUBLINGUAL | Status: DC | PRN

## 2014-03-28 MED ORDER — HEPARIN SODIUM (PORCINE) 1000 UNIT/ML IJ SOLN
INTRAMUSCULAR | Status: AC
  Filled 2014-03-28: qty 1

## 2014-03-28 MED ORDER — SODIUM CHLORIDE 0.9 % IJ SOLN
3.0000 mL | Freq: Two times a day (BID) | INTRAMUSCULAR | Status: DC
Start: 2014-03-28 — End: 2014-03-28

## 2014-03-28 MED ORDER — HEPARIN BOLUS VIA INFUSION
4000.0000 [IU] | Freq: Once | INTRAVENOUS | Status: AC
  Administered 2014-03-28: 4000 [IU] via INTRAVENOUS
  Filled 2014-03-28: qty 4000

## 2014-03-28 MED ORDER — SODIUM CHLORIDE 0.9 % IV SOLN: 250.0000 mL | INTRAVENOUS | Status: DC | PRN

## 2014-03-28 MED ORDER — MIDAZOLAM HCL 2 MG/2ML IJ SOLN
INTRAMUSCULAR | Status: AC
  Filled 2014-03-28: qty 2

## 2014-03-28 MED ORDER — NICOTINE 21 MG/24HR TD PT24
21.0000 mg | MEDICATED_PATCH | Freq: Every day | TRANSDERMAL | Status: DC
  Administered 2014-03-29: 11:00:00 21 mg via TRANSDERMAL
  Filled 2014-03-28 (×2): qty 1

## 2014-03-28 MED ORDER — SODIUM CHLORIDE 0.9 % IJ SOLN: 3.0000 mL | Freq: Two times a day (BID) | INTRAMUSCULAR | Status: DC

## 2014-03-28 MED ORDER — ACETAMINOPHEN 325 MG PO TABS: 650.0000 mg | ORAL_TABLET | Freq: Four times a day (QID) | ORAL | Status: DC | PRN

## 2014-03-28 MED ORDER — LIDOCAINE HCL (PF) 1 % IJ SOLN
INTRAMUSCULAR | Status: AC
  Filled 2014-03-28: qty 30

## 2014-03-28 MED ORDER — NITROGLYCERIN 2 % TD OINT
1.0000 [in_us] | TOPICAL_OINTMENT | Freq: Once | TRANSDERMAL | Status: AC
  Administered 2014-03-28: 1 [in_us] via TOPICAL
  Filled 2014-03-28: qty 1

## 2014-03-28 MED ORDER — INSULIN ASPART 100 UNIT/ML ~~LOC~~ SOLN: 0.0000 [IU] | Freq: Three times a day (TID) | SUBCUTANEOUS | Status: DC

## 2014-03-28 MED ORDER — SODIUM CHLORIDE 0.9 % IJ SOLN: 3.0000 mL | INTRAMUSCULAR | Status: DC | PRN

## 2014-03-28 MED ORDER — SODIUM CHLORIDE 0.9 % IV SOLN
250.0000 mL | INTRAVENOUS | Status: DC | PRN
Start: 2014-03-28 — End: 2014-03-28

## 2014-03-28 MED ORDER — HEPARIN (PORCINE) IN NACL 2-0.9 UNIT/ML-% IJ SOLN
INTRAMUSCULAR | Status: AC
  Filled 2014-03-28: qty 500

## 2014-03-28 MED ORDER — ATORVASTATIN CALCIUM 40 MG PO TABS
40.0000 mg | ORAL_TABLET | Freq: Every day | ORAL | Status: DC
  Filled 2014-03-28: qty 1

## 2014-03-28 MED ORDER — VERAPAMIL HCL 2.5 MG/ML IV SOLN
INTRAVENOUS | Status: AC
  Filled 2014-03-28: qty 2

## 2014-03-28 MED ORDER — FENTANYL CITRATE 0.05 MG/ML IJ SOLN
INTRAMUSCULAR | Status: AC
  Filled 2014-03-28: qty 2

## 2014-03-28 MED ORDER — PRASUGREL HCL 10 MG PO TABS
ORAL_TABLET | ORAL | Status: AC
  Filled 2014-03-28: qty 6

## 2014-03-28 MED ORDER — BIVALIRUDIN 250 MG IV SOLR
INTRAVENOUS | Status: AC
  Filled 2014-03-28: qty 250

## 2014-03-28 MED ORDER — HEPARIN (PORCINE) IN NACL 100-0.45 UNIT/ML-% IJ SOLN
950.0000 [IU]/h | INTRAMUSCULAR | Status: DC
  Administered 2014-03-28: 950 [IU]/h via INTRAVENOUS
  Filled 2014-03-28: qty 250

## 2014-03-28 MED ORDER — SODIUM CHLORIDE 0.9 % IV SOLN: INTRAVENOUS | Status: DC

## 2014-03-28 MED ORDER — HEPARIN (PORCINE) IN NACL 2-0.9 UNIT/ML-% IJ SOLN
INTRAMUSCULAR | Status: AC
  Filled 2014-03-28: qty 1000

## 2014-03-28 MED ORDER — ESCITALOPRAM OXALATE 10 MG PO TABS: 10.0000 mg | ORAL_TABLET | Freq: Every day | ORAL | Status: DC

## 2014-03-28 MED ORDER — ASPIRIN EC 81 MG PO TBEC
81.0000 mg | DELAYED_RELEASE_TABLET | Freq: Every day | ORAL | Status: DC
  Administered 2014-03-29: 81 mg via ORAL
  Filled 2014-03-28: qty 1

## 2014-03-28 MED ORDER — ESCITALOPRAM OXALATE 10 MG PO TABS
10.0000 mg | ORAL_TABLET | Freq: Every day | ORAL | Status: DC
Start: 2014-03-29 — End: 2014-03-29
  Administered 2014-03-29: 10:00:00 10 mg via ORAL
  Filled 2014-03-28: qty 1

## 2014-03-28 MED ORDER — ASPIRIN 81 MG PO CHEW: 81.0000 mg | CHEWABLE_TABLET | ORAL | Status: DC

## 2014-03-28 MED ORDER — SODIUM CHLORIDE 0.9 % IV SOLN: 1.0000 mL/kg/h | INTRAVENOUS | Status: AC

## 2014-03-28 MED ORDER — NITROGLYCERIN 1 MG/10 ML FOR IR/CATH LAB
INTRA_ARTERIAL | Status: AC
  Filled 2014-03-28: qty 10

## 2014-03-28 MED ORDER — ATORVASTATIN CALCIUM 80 MG PO TABS
80.0000 mg | ORAL_TABLET | Freq: Every day | ORAL | Status: DC
  Administered 2014-03-28: 19:00:00 80 mg via ORAL
  Filled 2014-03-28 (×2): qty 1

## 2014-03-28 MED ORDER — ASPIRIN 325 MG PO TABS
325.0000 mg | ORAL_TABLET | ORAL | Status: AC
  Administered 2014-03-28: 325 mg via ORAL
  Filled 2014-03-28: qty 1

## 2014-03-28 NOTE — ED Notes (Signed)
Per EMS pt start c/o chest pain 2 hours ago 8/10; pt c/o pain 3/10 on arrival described as burning pain; Pt has hx of smoking; denies n/v/sob; pt A&Ox4 and ambulatory on arrival; Pt took 325mg   Asprin at home with 2 x nitro with relief.

## 2014-03-28 NOTE — Progress Notes (Signed)
Pt pain free,awaiting cath. Arrived in cath holding from ED.

## 2014-03-28 NOTE — Care Management Note (Addendum)
    Page 1 of 1   03/29/2014     1:59:39 PM CARE MANAGEMENT NOTE 03/29/2014  Patient:  Tony Lynch,Tony Lynch   Account Number:  [id number]  Date Initiated:  03/28/2014  Documentation initiated by:  HUTCHINSON,CRYSTAL  Subjective/Objective Assessment:   CP     Action/Plan:   CM to follow for disposition needs   Anticipated DC Date:  03/29/2014   Anticipated DC Plan:  HOME/SELF CARE         Choice offered to / List presented to:             Status of service:  Completed, signed off Medicare Important Message given?  NA - LOS <3 / Initial given by admissions (If response is "NO", the following Medicare IM given date fields will be blank) Date Medicare IM given:   Medicare IM given by:   Date Additional Medicare IM given:   Additional Medicare IM given by:    Discharge Disposition:  HOME/SELF CARE  Per UR Regulation:  Reviewed for med. necessity/level of care/duration of stay  If discussed at Long Length of Stay Meetings, dates discussed:    Comments:  1352 03-29-14 Tomi BambergerBrenda Graves-Bigelow, RN,BSN [phone] CM did call pt in ref to effient. [facility] supply free. Pt has questions in regards to Lipitor. [facility] has Lipitor cheaper at 10.00. Pt has hospital f/u with cardiology and CM made him aware to mention that if possible to be changed to cheaper statin. No further needs at this time.    0940 03-29-14 Tomi BambergerBrenda Graves-Bigelow, RN,BSN [phone] Benefits check in process for effient. Will make pt aware once completed.   Crystal Hutchinson RN, BSN, MSHL, CCM  Nurse - Case Manager,  ([phone])  [phone]  03/27/2014

## 2014-03-28 NOTE — Consult Note (Signed)
Referring Physician: Dr. Desma Maxim ([facility]) Primary Physician: Primary Cardiologist: none Reason for Consultation: CP   HPI:  64 y.o male with h/o DM2, ongoing tobacco abuse and strong FHx CAD whom we are asked to see for CP.  Denies any h/o known CAD.  Has never had a cath.  Patient woke up around 2 am getting ready for work drinking coffee and watching TV. Around 4:30-5 am he got in the shower and was getting ready for work. He had severe mid chest pain radiating to left arm and neck which was worse with exertion. + diaphoresis.   He tried to lie down on the couch and chest pain stopped and then started hurting again. He called his brother who gave him a NTG. Chest pain was associated with sweating (head to waist). Denies sob, nausea/vomiting, h/a, lightheadedness/dizziness. In ER, chest pain was relieved with Aspirin and total 2 NTG now 0/10 chest pain. ECG and trop are ok.   Reports LLE claudication  Patient has a significant FH of heart disease in sister and brother.     Review of Systems:     Cardiac Review of Systems: {Y] = yes [ ]  = no  Chest Pain [ y   ]  Resting SOB [   ] Exertional SOB  [  ]  Orthopnea [  ]   Pedal Edema [   ]    Palpitations [  ] Syncope  [  ]   Presyncope [   ]  General Review of Systems: [Y] = yes [  ]=no Constitional: recent weight change [  ]; anorexia [  ]; fatigue [  ]; nausea [  ]; night sweats [  ]; fever [  ]; or chills [  ];                                                                     Eyes : blurred vision [  ]; diplopia [   ]; vision changes [  ];  Amaurosis fugax[  ]; Resp: cough [  ];  wheezing[  ];  hemoptysis[  ];  PND [  ];  GI:  gallstones[  ], vomiting[  ];  dysphagia[  ]; melena[  ];  hematochezia [  ]; heartburn[  ];   GU: kidney stones [  ]; hematuria[  ];   dysuria [  ];  nocturia[  ]; incontinence [  ];             Skin: rash, swelling[  ];, hair loss[  ];  peripheral edema[  ];  or itching[  ]; Musculosketetal:  myalgias[  ];  joint swelling[  ];  joint erythema[  ];  joint pain[  ];  back pain[  ];  Heme/Lymph: bruising[  ];  bleeding[  ];  anemia[  ];  Neuro: TIA[  ];  headaches[  ];  stroke[  ];  vertigo[  ];  seizures[  ];   paresthesias[  ];  difficulty walking[  ];  Psych:depression[  ]; anxiety[  ];  Endocrine: diabetes[  y];  thyroid dysfunction[  ];  Other:  Past Medical History  Diagnosis Date  . Depression   . Diabetes mellitus without complication   . OA (  osteoarthritis)     hip, back  . Tobacco abuse      (Not in a hospital admission)   . escitalopram  10 mg Oral Daily    Infusions:    No Known Allergies  History   Social History  . Marital Status: Divorced    Spouse Name: N/A    Number of Children: N/A  . Years of Education: N/A   Occupational History  . Not on file.   Social History Main Topics  . Smoking status: Current Some Day Smoker    Types: Cigarettes  . Smokeless tobacco: Not on file  . Alcohol Use: No  . Drug Use: No  . Sexual Activity: Not on file   Other Topics Concern  . Not on file   Social History Narrative   Smoker 1.5 ppd x 50 years    Lives alone    2 kids, divorced    Works at [employer] full time    5/6 siblings alive              Family History  Problem Relation Age of Onset  . Diabetes Sister   . Heart disease Sister     died of MI  . Stroke Sister     2 strokes   . Heart disease Brother     multiple cardiac surgeries  . Heart disease Sister     stent    PHYSICAL EXAM: Filed Vitals:   03/28/14 0816  BP: 108/47  Pulse: 49  Temp:   Resp: 13    No intake or output data in the 24 hours ending 03/28/14 1100  General:  Well appearing. No respiratory difficulty HEENT: normal Neck: supple. no JVD. Carotids 2+ bilat; ? Faint L carotid bruit. No lymphadenopathy or thryomegaly appreciated. Cor: PMI nondisplaced. Huston FoleyBrady. Regular.  No rubs, gallops or murmurs. Lungs: clear with decreased BS throughout Abdomen:  soft, nontender, nondistended. No hepatosplenomegaly. No bruits or masses. Good bowel sounds. Extremities: no cyanosis, clubbing, rash, edema DP pulse 1+ R and 2+ L  Neuro: alert & oriented x 3, cranial nerves grossly intact. moves all 4 extremities w/o difficulty. Affect pleasant.  ECG: Sinus brady 53 with PACs. TWI v1,v2. No old.   Results for orders placed or performed during the hospital encounter of 03/28/14 (from the past 24 hour(s))  CBC     Status: None   Collection Time: 03/28/14  7:30 AM  Result Value Ref Range   WBC 8.5 4.0 - 10.5 K/uL   RBC 5.10 4.22 - 5.81 MIL/uL   Hemoglobin 15.6 13.0 - 17.0 g/dL   HCT 58.045.5 99.839.0 - 33.852.0 %   MCV 89.2 78.0 - 100.0 fL   MCH 30.6 26.0 - 34.0 pg   MCHC 34.3 30.0 - 36.0 g/dL   RDW 25.013.3 53.911.5 - 76.715.5 %   Platelets 259 150 - 400 K/uL  Basic metabolic panel     Status: Abnormal   Collection Time: 03/28/14  7:30 AM  Result Value Ref Range   Sodium 139 137 - 147 mEq/L   Potassium 4.9 3.7 - 5.3 mEq/L   Chloride 100 96 - 112 mEq/L   CO2 26 19 - 32 mEq/L   Glucose, Bld 154 (H) 70 - 99 mg/dL   BUN 9 6 - 23 mg/dL   Creatinine, Ser 3.410.94 0.50 - 1.35 mg/dL   Calcium 9.3 8.4 - 93.710.5 mg/dL   GFR calc non Af Amer 88 (L) >90 mL/min   GFR calc Af Amer >  90 >90 mL/min   Anion gap 13 5 - 15  I-stat troponin, ED ([facility])     Status: None   Collection Time: 03/28/14  7:38 AM  Result Value Ref Range   Troponin i, poc 0.07 0.00 - 0.08 ng/mL   Comment 3          Troponin I (q 6hr x 3)     Status: None   Collection Time: 03/28/14  9:00 AM  Result Value Ref Range   Troponin I <0.30 <0.30 ng/mL   Dg Chest Port 1 View  03/28/2014   CLINICAL DATA:  Chest pain for 1 day  EXAM: PORTABLE CHEST - 1 VIEW  COMPARISON:  May 18, 2013  FINDINGS: There is no edema or consolidation. There is underlying emphysematous change. There are small calcified granulomas in the right upper lobe. There is no edema or consolidation. Heart size is normal. The pulmonary vascularity  reflects underlying emphysema. No adenopathy. No bone lesions.  IMPRESSION: Underlying emphysematous change with scattered small granulomas in the right upper lobe. No edema or consolidation.   Electronically Signed   By: Bretta BangWilliam  Woodruff M.D.   On: 03/28/2014 07:56     ASSESSMENT: 1. Chest pain c/w unstable angina 2. DM2 3. FHx CAD 4. Tobacco use ongoing 5. ? L carotid bruit 6. PAD with intermittent claudication  PLAN/DISCUSSION:  CP very concerning for [place]. Will need cath today. Treat with ASA, heparin, statin. No b-blocker due to bradycardia. Counseled on need to stop smoking. Will get carotid u/s and do ab aortogram at cath. +/- ABIs  Kamarie Veno,MD 11:06 AM

## 2014-03-28 NOTE — CV Procedure (Signed)
CARDIAC CATHETERIZATION AND PERCUTANEOUS CORONARY INTERVENTION REPORT  NAME:  Tony Lynch   MRN: [id number] DOB:  11/17/1950   ADMIT DATE: 03/28/2014 Procedure Date: 03/28/2014  INTERVENTIONAL CARDIOLOGIST: Leonie Man, M.D., MS PRIMARY CARE PROVIDER: Lorayne Marek, MD PRIMARY CARDIOLOGIST: New to [facility]  PATIENT:  Tony Lynch is a 63 y.o. male history of type 2 diabetes, ongoing tobacco abuse and strong family history of CAD who presented to [facility] emergency room with signs symptoms of unstable angina. He is referred for cardiac catheterization.  PRE-OPERATIVE DIAGNOSIS:    Unstable Angina  PROCEDURES PERFORMED:    Left Heart Catheterization with Native Coronary Angiography  via Right Radial Artery   Left Ventriculography  2 site Percutaneous Coronary Intervention of the proximal and mid LAD with A Xience Alpine DES Stents  PROCEDURE: The patient was brought to the [facility] in the fasting state and prepped and draped in the usual sterile fashion for Right Radial artery access. A modified Allen's test was performed on the right wrist demonstrating excellent collateral flow for radial access.   Sterile technique was used including antiseptics, cap, gloves, gown, hand hygiene, mask and sheet. Skin prep: Chlorhexidine.   Consent: Risks of procedure as well as the alternatives and risks of each were explained to the (patient/caregiver). Consent for procedure obtained.   Time Out: Verified patient identification, verified procedure, site/side was marked, verified correct patient position, special equipment/implants available, medications/allergies/relevent history reviewed, required imaging and test results available. Performed.  Access:   Right Radial Artery: 6 Fr Sheath -  Seldinger Technique (Angiocath Micropuncture Kit)  Radial Cocktail - 10 mL; IV Heparin 4000 Units   Left Heart Catheterization: 5 Fr Catheters advanced  or exchanged over a long exchange safety J-wire; TIG 4.0 catheter advanced first.  Left And Right Coronary Artery Cineangiography: TIG 4.0 Catheter   LV Hemodynamics (LV Tony): Angled Pigtail  Sheath removed in the  cardiac catheterization lab with TR band placement for hemostasis.  TR Band:  1540  Hours;  12 mL air  FINDINGS:  Hemodynamics:   Central Aortic Pressure / Mean:  124/59/84 mmHg  Left Ventricular Pressure / LVEDP:  120/0/6 mmHg  Left Ventriculography:  EF:  60-65 %  Wall Motion:  Normal  Coronary Anatomy:  Dominance:  Right  Left Main:  Large-caliber vessel that bifurcates proximally to the Circumflex and LAD. Angiographically normal. LAD:  Large-caliber vessel that has proximal tapering with a 95% focal stenosis at the takeoff of septal perforator 1 and a bifurcating first diagonal branch that is small in diameter. There is then a 70% tubular lesion in the mid LAD. The LAD then wraps down around the apex perfusing the distal inferoapex. There is mild 30-40% disease apically.  D1:  Small caliber very proximal vessel that bifurcates proximally into 2 small caliber vessels. Mild diffuse luminal irregularities. Not involved with proximal LAD stenosis.  Left Circumflex:  Large caliber, nondominant vessel it gives off a small AV groove branch before terminates as a large bifurcating lateral OM. The superior branch is larger in caliber and bifurcates distally. The inferior branch is moderate caliber and has a tubular 60% stenosis before it bifurcates into small caliber ramus.   RCA:  Large-caliber, dominant vessel with proximal 30% stenosis with a downward takeoff. There is in a tapered 40-50% mid lesion that is relatively long before normalizes again. It then bifurcates distally into the Right Posterior Descending Artery (RPDA) and the  Right Posterior  AV Groove Branch (RPAV).  RPDA:  Moderate caliber vessel which reaches almost to the apex. There is a ostial 30%  stenosis.  RPL Sysytem:The RPAV begins as a moderate caliber vessel bifurcates into 2 small moderate caliber posterior lateral branches.  After reviewing the initial angiography, the culprit lesion was thought to be a combination of the proximal LAD 95% and mid LAD 70% lesions..  Preparation were made to proceed with PCI on these lesions.  Percutaneous Coronary Intervention:    Angiomax bolus was administered, Effient 60 mg by mouth administered. Guide: 6 Fr   XB LAD Guidewire: [place] for LAD, BMW for diagonal  Lesion #1: mid LAD 70% - reduced to 0%, TIMI 3 pre& post Predilation Balloon: Euphora 2.0 mm x 15 mm;   10 Atm x 20 Sec Stent: Xience Alpine DES 2.5 mm x 18 mm;   18 Atm x 30 Sec - 2.8 mm Post-dilation Balloon: [place] Emerge 3.0 mm x 12 mm;   10 Atm x 30 Sec - proximal stent postdilated to 2.9 mm; slight step up to the proximal stent  Lesion #2: proxLAD 70-99% - reduced to 0%, TIMI 3 pre& post Predilation Balloon: Euphora 2.0 mm x 12 mm;   10 Atm x 30 Sec Stent: Xience Alpine DES 2.75 mm x 18 mm;   16 Atm x 30 Sec Post-dilation Balloon: [place] Emerge 3.0 mm x 12 mm;   14 Atm x 30 Sec Post-dilation Balloon #2: [place] Emerge 3.5 mm x 12 mm;   18 Atm x 45 Sec x 2 inflations  Final Diameter: 3.6 mm proximally. There is a slight step up prior to the stent  Post deployment angiography in multiple views, with and without guidewire in place revealed excellent stent deployment and lesion coverage.  There was no evidence of dissection or perforation.  MEDICATIONS:  Anesthesia:  Local Lidocaine 2 ml  Sedation:  2 mg IV Versed, 50 mcg IV fentanyl ;   Omnipaque Contrast: 260 ml  Anticoagulation:  IV Heparin 4000 Units ; Angiomax Bolus & drip  Anti-Platelet Agent:  Effient 60 mg by mouth Radial Cocktail: 5 mg Verapamil, 400 mcg NTG, 2 ml 2% Lidocaine in 10 ml NS IC NTG 200 mcg x 3  PATIENT DISPOSITION:    The patient was transferred to the PACU holding area in a hemodynamicaly  stable, chest pain free condition.  The patient tolerated the procedure well, and there were no complications.  EBL:   < 10 ml  The patient was stable before, during, and after the procedure.  POST-OPERATIVE DIAGNOSIS:    Severe single-vessel disease involving the proximal and mid LAD with 95% proximal and 70% mid lesions both treated with a Xience alpine DES stents.  Moderate disease in the inferior branch of the lateral OM and mild to moderate disease in the RCA.  Well-preserved LVEF with normal LVEDP. No wall motion abnormalities.  PLAN OF CARE:  Admit overnight for monitoring post radial cath PCI for unstable angina.  Dual antiplatelet therapy for minimum one year  Will add statin.  No beta blocker due to bradycardia on the Cath Lab table.  We'll need to consider ACE inhibitor pending blood pressure evaluation post-cath.   Leonie Man, M.D., M.S. Interventional Cardiologist   Pager # [phone]

## 2014-03-28 NOTE — H&P (View-Only) (Signed)
Referring Physician: Dr. Desma Maxim ([facility]) Primary Physician: Primary Cardiologist: none Reason for Consultation: CP   HPI:  63 y.o male with h/o DM2, ongoing tobacco abuse and strong FHx CAD whom we are asked to see for CP.  Denies any h/o known CAD.  Has never had a cath.  Patient woke up around 2 am getting ready for work drinking coffee and watching TV. Around 4:30-5 am he got in the shower and was getting ready for work. He had severe mid chest pain radiating to left arm and neck which was worse with exertion. + diaphoresis.   He tried to lie down on the couch and chest pain stopped and then started hurting again. He called his brother who gave him a NTG. Chest pain was associated with sweating (head to waist). Denies sob, nausea/vomiting, h/a, lightheadedness/dizziness. In ER, chest pain was relieved with Aspirin and total 2 NTG now 0/10 chest pain. ECG and trop are ok.   Reports LLE claudication  Patient has a significant FH of heart disease in sister and brother.     Review of Systems:     Cardiac Review of Systems: {Y] = yes [ ]  = no  Chest Pain [ y   ]  Resting SOB [   ] Exertional SOB  [  ]  Orthopnea [  ]   Pedal Edema [   ]    Palpitations [  ] Syncope  [  ]   Presyncope [   ]  General Review of Systems: [Y] = yes [  ]=no Constitional: recent weight change [  ]; anorexia [  ]; fatigue [  ]; nausea [  ]; night sweats [  ]; fever [  ]; or chills [  ];                                                                     Eyes : blurred vision [  ]; diplopia [   ]; vision changes [  ];  Amaurosis fugax[  ]; Resp: cough [  ];  wheezing[  ];  hemoptysis[  ];  PND [  ];  GI:  gallstones[  ], vomiting[  ];  dysphagia[  ]; melena[  ];  hematochezia [  ]; heartburn[  ];   GU: kidney stones [  ]; hematuria[  ];   dysuria [  ];  nocturia[  ]; incontinence [  ];             Skin: rash, swelling[  ];, hair loss[  ];  peripheral edema[  ];  or itching[  ]; Musculosketetal:  myalgias[  ];  joint swelling[  ];  joint erythema[  ];  joint pain[  ];  back pain[  ];  Heme/Lymph: bruising[  ];  bleeding[  ];  anemia[  ];  Neuro: TIA[  ];  headaches[  ];  stroke[  ];  vertigo[  ];  seizures[  ];   paresthesias[  ];  difficulty walking[  ];  Psych:depression[  ]; anxiety[  ];  Endocrine: diabetes[  y];  thyroid dysfunction[  ];  Other:  Past Medical History  Diagnosis Date  . Depression   . Diabetes mellitus without complication   . OA (  osteoarthritis)     hip, back  . Tobacco abuse      (Not in a hospital admission)   . escitalopram  10 mg Oral Daily    Infusions:    No Known Allergies  History   Social History  . Marital Status: Divorced    Spouse Name: N/A    Number of Children: N/A  . Years of Education: N/A   Occupational History  . Not on file.   Social History Main Topics  . Smoking status: Current Some Day Smoker    Types: Cigarettes  . Smokeless tobacco: Not on file  . Alcohol Use: No  . Drug Use: No  . Sexual Activity: Not on file   Other Topics Concern  . Not on file   Social History Narrative   Smoker 1.5 ppd x 50 years    Lives alone    2 kids, divorced    Works at [employer] full time    5/6 siblings alive              Family History  Problem Relation Age of Onset  . Diabetes Sister   . Heart disease Sister     died of MI  . Stroke Sister     2 strokes   . Heart disease Brother     multiple cardiac surgeries  . Heart disease Sister     stent    PHYSICAL EXAM: Filed Vitals:   03/28/14 0816  BP: 108/47  Pulse: 49  Temp:   Resp: 13    No intake or output data in the 24 hours ending 03/28/14 1100  General:  Well appearing. No respiratory difficulty HEENT: normal Neck: supple. no JVD. Carotids 2+ bilat; ? Faint L carotid bruit. No lymphadenopathy or thryomegaly appreciated. Cor: PMI nondisplaced. [place] FoleyBrady. Regular.  No rubs, gallops or murmurs. Lungs: clear with decreased BS throughout Abdomen:  soft, nontender, nondistended. No hepatosplenomegaly. No bruits or masses. Good bowel sounds. Extremities: no cyanosis, clubbing, rash, edema DP pulse 1+ R and 2+ L  Neuro: alert & oriented x 3, cranial nerves grossly intact. moves all 4 extremities w/o difficulty. Affect pleasant.  ECG: Sinus brady 53 with PACs. TWI v1,v2. No old.   Results for orders placed or performed during the hospital encounter of 03/28/14 (from the past 24 hour(s))  CBC     Status: None   Collection Time: 03/28/14  7:30 AM  Result Value Ref Range   WBC 8.5 4.0 - 10.5 K/uL   RBC 5.10 4.22 - 5.81 MIL/uL   Hemoglobin 15.6 13.0 - 17.0 g/dL   HCT 58.045.5 99.839.0 - 33.852.0 %   MCV 89.2 78.0 - 100.0 fL   MCH 30.6 26.0 - 34.0 pg   MCHC 34.3 30.0 - 36.0 g/dL   RDW 25.013.3 53.911.5 - 76.715.5 %   Platelets 259 150 - 400 K/uL  Basic metabolic panel     Status: Abnormal   Collection Time: 03/28/14  7:30 AM  Result Value Ref Range   Sodium 139 137 - 147 mEq/L   Potassium 4.9 3.7 - 5.3 mEq/L   Chloride 100 96 - 112 mEq/L   CO2 26 19 - 32 mEq/L   Glucose, Bld 154 (H) 70 - 99 mg/dL   BUN 9 6 - 23 mg/dL   Creatinine, Ser 3.410.94 0.50 - 1.35 mg/dL   Calcium 9.3 8.4 - 93.710.5 mg/dL   GFR calc non Af Amer 88 (L) >90 mL/min   GFR calc Af Amer >  90 >90 mL/min   Anion gap 13 5 - 15  I-stat troponin, ED (not [facility])     Status: None   Collection Time: 03/28/14  7:38 AM  Result Value Ref Range   Troponin i, poc 0.07 0.00 - 0.08 ng/mL   Comment 3          Troponin I (q 6hr x 3)     Status: None   Collection Time: 03/28/14  9:00 AM  Result Value Ref Range   Troponin I <0.30 <0.30 ng/mL   Dg Chest Port 1 View  03/28/2014   CLINICAL DATA:  Chest pain for 1 day  EXAM: PORTABLE CHEST - 1 VIEW  COMPARISON:  May 18, 2013  FINDINGS: There is no edema or consolidation. There is underlying emphysematous change. There are small calcified granulomas in the right upper lobe. There is no edema or consolidation. Heart size is normal. The pulmonary vascularity  reflects underlying emphysema. No adenopathy. No bone lesions.  IMPRESSION: Underlying emphysematous change with scattered small granulomas in the right upper lobe. No edema or consolidation.   Electronically Signed   By: Bretta BangWilliam  Woodruff M.D.   On: 03/28/2014 07:56     ASSESSMENT: 1. Chest pain c/w unstable angina 2. DM2 3. FHx CAD 4. Tobacco use ongoing 5. ? L carotid bruit 6. PAD with intermittent claudication  PLAN/DISCUSSION:  CP very concerning for [place]. Will need cath today. Treat with ASA, heparin, statin. No b-blocker due to bradycardia. Counseled on need to stop smoking. Will get carotid u/s and do ab aortogram at cath. +/- ABIs  Daniel Bensimhon,MD 11:06 AM

## 2014-03-28 NOTE — H&P (Signed)
Date: 03/28/2014               Patient Name:  Tony Lynch MRN: [id number]  DOB: [birth year] Age / Sex: 63 y.o., male   PCP: Lorayne Marek, MD         Medical Service: Internal Medicine Teaching Service         Attending Physician: Dr. Ellwood Dense    First Contact: Dr. Raelene Bott Pager: [phone]  Second Contact: Dr. Redmond Pulling  Pager: [phone]       After Hours (After 5p/  First Contact Pager: [phone]  weekends / holidays): Second Contact Pager: [phone]   Chief Complaint: Chest pain  History of Present Illness:  63 y.o DM2, tobacco abuse presented with chest pain.  Patient woke up around 2 am getting ready for work drinking coffee and watching TV. Around 4:30-5 am he got in the shower and was getting ready for work.  He had new 9-9.5/10 knife- stabbing mid chest pain radiating to left arm, neck with exertion.  He was eating a biscuit for breakfast and still experienced chest pain.  He tried to lie down on the couch and chest pain stopped and then started hurting again.  He called his brother who gave him a NTG.  Chest pain was associated with sweating (head to waist). Denies sob, nausea/vomiting, h/a, lightheadedness/dizziness.  Chest pain was relieved with Aspirin and total 2 NTG now 0/10 chest pain.  Patient has a significant FH of heart disease.   Meds: Current Facility-Administered Medications  Medication Dose Route Frequency Provider Last Rate Last Dose  . escitalopram (LEXAPRO) tablet 10 mg  10 mg Oral Daily Cresenciano Genre, MD      . nitroGLYCERIN (NITROSTAT) SL tablet 0.4 mg  0.4 mg Sublingual Q5 min PRN Babette Relic, MD       Current Outpatient Prescriptions  Medication Sig Dispense Refill  . escitalopram (LEXAPRO) 10 MG tablet Take 1 tablet (10 mg total) by mouth daily. 30 tablet 3  . metFORMIN (GLUCOPHAGE) 500 MG tablet TAKE 1 TABLET BY MOUTH TWICE A DAY WITH A MEAL 60 tablet 2  . amitriptyline (ELAVIL) 25 MG tablet Take 1 tablet (25 mg total) by mouth at bedtime. (Patient not taking:  Reported on 03/28/2014) 30 tablet 1  . cyclobenzaprine (FLEXERIL) 5 MG tablet Take 1 tablet (5 mg total) by mouth 3 (three) times daily as needed for muscle spasms. (Patient not taking: Reported on 03/28/2014) 30 tablet 1  . glucose monitoring kit (FREESTYLE) monitoring kit 1 each by Does not apply route as needed for other. Dispense any model that is covered- dispense testing supplies for Q AC/ HS accuchecks- 1 month supply with one refil. (Patient not taking: Reported on 03/28/2014) 1 each 1  . nicotine (NICODERM CQ) 21 mg/24hr patch Place 1 patch (21 mg total) onto the skin daily. (Patient not taking: Reported on 03/28/2014) 28 patch 0  . traMADol (ULTRAM) 50 MG tablet Take 1 tablet (50 mg total) by mouth every 8 (eight) hours as needed. (Patient not taking: Reported on 03/28/2014) 30 tablet 2    Allergies: Allergies as of 03/28/2014  . (No Known Allergies)   Past Medical History  Diagnosis Date  . Depression   . Diabetes mellitus without complication   . OA (osteoarthritis)     hip, back   Past Surgical History  Procedure Laterality Date  . Hernia repair     Family History  Problem Relation Age of Onset  . Diabetes Sister   .  Heart disease Sister     died of MI  . Stroke Sister   . Heart disease Brother     multiple cardiac surgeries   History   Social History  . Marital Status: Divorced    Spouse Name: N/A    Number of Children: N/A  . Years of Education: N/A   Occupational History  . Not on file.   Social History Main Topics  . Smoking status: Current Some Day Smoker    Types: Cigarettes  . Smokeless tobacco: Not on file  . Alcohol Use: No  . Drug Use: No  . Sexual Activity: Not on file   Other Topics Concern  . Not on file   Social History Narrative   Smoker 1.5 ppd x 50 years    Lives alone    2 kids, divorced    Works at [employer] full time    5/6 siblings alive              Review of Systems: General: denies fever/chills, +sweating (head to  waist) HEENT: denies h/a Cardiac:+9-9.5/10 new knife- stabbing mid chest pain radiated to left arm, neck with exertion noted ~5 am this am relieved with Aspirin and 2 NTG now 0/10 chest pain.   Pulm: denies sob, cough  Abd: denies nausea/vomiting, denies ab pain Ext: denies lower extremities swelling  Neuro: denies h/a, lightheadedness/dizziness    Physical Exam: BP 132/61, HR 47-58, 99-100% RA, RR 20 Blood pressure 108/47, pulse 49, temperature 98.1 F (36.7 C), temperature source Oral, resp. rate 13, height $RemoveBe'6\' 1"'lcIAVoZto$  (1.854 m), weight 180 lb (81.647 kg), SpO2 99 %. Vitals reviewed. General: resting in bed, NAD HEENT: [place]/at, wet oral mucosa  Cardiac: bradycardic, no rubs, murmurs or gallops Pulm: clear to auscultation bilaterally, no wheezes, rales, or rhonchi Abd: soft, nontender, nondistended, BS present Ext: warm and well perfused, no pedal edema Neuro: alert and oriented X3, CN 2-12 grossly intact, moving all 4 extremities    Lab results: Basic Metabolic Panel:  Recent Labs  03/28/14 0730  NA 139  K 4.9  CL 100  CO2 26  GLUCOSE 154*  BUN 9  CREATININE 0.94  CALCIUM 9.3   Liver Function Tests: No results for input(s): AST, ALT, ALKPHOS, BILITOT, PROT, ALBUMIN in the last 72 hours. No results for input(s): LIPASE, AMYLASE in the last 72 hours. No results for input(s): AMMONIA in the last 72 hours. CBC:  Recent Labs  03/28/14 0730  WBC 8.5  HGB 15.6  HCT 45.5  MCV 89.2  PLT 259   Cardiac Enzymes:  Recent Labs  03/28/14 0900  TROPONINI <0.30   CBG: No results for input(s): GLUCAP in the last 72 hours. Hemoglobin A1C: No results for input(s): HGBA1C in the last 72 hours. Fasting Lipid Panel: No results for input(s): CHOL, HDL, LDLCALC, TRIG, CHOLHDL, LDLDIRECT in the last 72 hours.  Urine Drug Screen: Drugs of Abuse     Component Value Date/Time   LABOPIA NEG 05/05/2013 0000   COCAINSCRNUR NEG 05/05/2013 0000   LABBENZ NEG 05/05/2013 0000    AMPHETMU NEG 05/05/2013 0000    Msc. Labs: Trop x 2  UDS  Imaging results:  Dg Chest Port 1 View  03/28/2014   CLINICAL DATA:  Chest pain for 1 day  EXAM: PORTABLE CHEST - 1 VIEW  COMPARISON:  May 18, 2013  FINDINGS: There is no edema or consolidation. There is underlying emphysematous change. There are small calcified granulomas in the right upper lobe.  There is no edema or consolidation. Heart size is normal. The pulmonary vascularity reflects underlying emphysema. No adenopathy. No bone lesions.  IMPRESSION: Underlying emphysematous change with scattered small granulomas in the right upper lobe. No edema or consolidation.   Electronically Signed   By: Lowella Grip M.D.   On: 03/28/2014 07:56    Other results: EKG:EKG with HR 54 irreg, nl intervals T wave inv in V2, borderline flattening in V3.   Assessment & Plan by Problem: 63 y.o with PMH DM 2, smoking presenting with typical chest pain.   #Typical Chest pain -r/o ACS likely has underlying CAD.  POC troponin negative in the ED. Will trend Trop x 3 (1st troponin negative), will check UDS -Aspirin 81 in the am, prn NTG, Heparin gtt now, started Lipitor 40 mg (can titrate up to 80 mg if can tolerate) -will check lipid panel, HA1C -admitted to observation -Dr. Sung Amabile consulted and rec. Heparin and will take patient to the cath lab today.  Pt to remain NPO for now   #Asymptomatic bradycardia -noted in the ED to upper 40s to 50s, asymptomatic  -EKG with HR 54 irreg, nl intervals T wave inv in V2, borderline flattening in V3.  -monitor via tele, will check tsh   #DM (diabetes mellitus) 2 -pending HA1C last 10/03/2013 was 6.6% -monitor cbgs, SSI-S, will repeat HA1C   #Smoking -smoking cessation  -Nicotine 21 mcg patch  #Depression -resumed Lexapro 10 mg qd   OA to hip and back  -prn tylenol   DVT px  -Heparin gtt, scds   F/E/N -NS 100 cc/hr  -BMET in am  -NPO for now    Dispo: Disposition is deferred at this  time, awaiting improvement of current medical problems. Anticipated discharge in approximately 1-2 day(s).   The patient does have a current PCP (Lorayne Marek, MD) and does not need an [facility] hospital follow-up appointment after discharge.  The patient does not have transportation limitations that hinder transportation to clinic appointments.  Signed: Cresenciano Genre, MD [phone] 03/28/2014, 10:40 AM

## 2014-03-28 NOTE — Progress Notes (Signed)
DR.Friedman notified about positive troponin 2.22 patient asymptomatic and stable at this time and will continue to monitor.

## 2014-03-28 NOTE — ED Notes (Addendum)
  Lab called for add on:  Hepatic function panel  Magnesium  Hemoglobin A1c  Lipid panel  TSH will need to be drawn as there was no gold top drawn already.

## 2014-03-28 NOTE — ED Provider Notes (Signed)
CSN: [id number]     Arrival date & time 03/28/14  3546 History   First MD Initiated Contact with Patient 03/28/14 0701     Chief Complaint  Patient presents with  . Chest Pain   HPI Tony Lynch is a 63 year old male with DM2, depression who presents with chest pain. This morning, he was getting ready for work when he had a sudden onset of 8/10 chest pain that radiated to his neck and L arm. It was associated with diaphoresis and weakness and improved with ASA & nitroglycerin x 2. He denies any recent fever, sick contacts, nausea, vomiting, abdominal pain. He reports family history significant for cardiac disease, including a sister who died of MI, sister with CVA, brother with multiple cardiac surgeries, but has no past cardiac history. He reports smoking 1.5 ppd x 50 years but no alcohol or illicit drug use.    Past Medical History  Diagnosis Date  . Depression   . Diabetes mellitus without complication    History reviewed. No pertinent past surgical history. Family History  Problem Relation Age of Onset  . Diabetes Sister   . Heart disease Sister    History  Substance Use Topics  . Smoking status: Current Some Day Smoker    Types: Cigarettes  . Smokeless tobacco: Not on file  . Alcohol Use: No    Review of Systems  Constitutional: Positive for diaphoresis. Negative for fever.  Cardiovascular: Positive for chest pain.  Gastrointestinal: Negative for nausea, vomiting, abdominal pain and diarrhea.      Allergies  Review of patient's allergies indicates no known allergies.  Home Medications   Prior to Admission medications   Medication Sig Start Date End Date Taking? Authorizing Provider  amitriptyline (ELAVIL) 25 MG tablet Take 1 tablet (25 mg total) by mouth at bedtime. 07/12/13   Duane Boston, MD  cyclobenzaprine (FLEXERIL) 5 MG tablet Take 1 tablet (5 mg total) by mouth 3 (three) times daily as needed for muscle spasms. 05/05/13   Reyne Dumas, MD  escitalopram (LEXAPRO) 10  MG tablet Take 1 tablet (10 mg total) by mouth daily. 05/05/13   Reyne Dumas, MD  glucose monitoring kit (FREESTYLE) monitoring kit 1 each by Does not apply route as needed for other. Dispense any model that is covered- dispense testing supplies for Q AC/ HS accuchecks- 1 month supply with one refil. 06/16/13   Lorayne Marek, MD  metFORMIN (GLUCOPHAGE) 500 MG tablet TAKE 1 TABLET BY MOUTH TWICE A DAY WITH A MEAL 02/23/14   Lorayne Marek, MD  nicotine (NICODERM CQ) 21 mg/24hr patch Place 1 patch (21 mg total) onto the skin daily. 06/16/13   Lorayne Marek, MD  traMADol (ULTRAM) 50 MG tablet Take 1 tablet (50 mg total) by mouth every 8 (eight) hours as needed. 05/05/13   Reyne Dumas, MD   BP 139/58 mmHg  Pulse 48  Temp(Src) 98.1 F (36.7 C) (Oral)  Resp 17  Ht $R'6\' 1"'gf$  (1.854 m)  Wt 180 lb (81.647 kg)  BMI 23.75 kg/m2  SpO2 100% Physical Exam  Constitutional: He is oriented to person, place, and time. No distress.  HENT:  Head: Normocephalic and atraumatic.  Mouth/Throat: Oropharynx is clear and moist.  Eyes: Conjunctivae and EOM are normal. Pupils are equal, round, and reactive to light.  Cardiovascular: Normal rate, regular rhythm, normal heart sounds and intact distal pulses.  Exam reveals no gallop and no friction rub.   No murmur heard. Pulmonary/Chest: No respiratory distress. He exhibits  no tenderness.  Abdominal: Soft. Bowel sounds are normal. He exhibits no distension. There is no tenderness.  Neurological: He is alert and oriented to person, place, and time. No cranial nerve deficit. Coordination normal.  Skin: He is not diaphoretic.    ED Course  Procedures (including critical care time) Labs Review Labs Reviewed  BASIC METABOLIC PANEL - Abnormal; Notable for the following:    Glucose, Bld 154 (*)    GFR calc non Af Amer 88 (*)    All other components within normal limits  CBC  TROPONIN I  Randolm Idol, ED    Imaging Review Dg Chest Port 1 View  03/28/2014   CLINICAL  DATA:  Chest pain for 1 day  EXAM: PORTABLE CHEST - 1 VIEW  COMPARISON:  May 18, 2013  FINDINGS: There is no edema or consolidation. There is underlying emphysematous change. There are small calcified granulomas in the right upper lobe. There is no edema or consolidation. Heart size is normal. The pulmonary vascularity reflects underlying emphysema. No adenopathy. No bone lesions.  IMPRESSION: Underlying emphysematous change with scattered small granulomas in the right upper lobe. No edema or consolidation.   Electronically Signed   By: Lowella Grip M.D.   On: 03/28/2014 07:56     EKG Interpretation   Date/Time:  Tuesday March 28 2014 07:10:09 EST Ventricular Rate:  53 PR Interval:  127 QRS Duration: 102 QT Interval:  441 QTC Calculation: 414 R Axis:   91 Text Interpretation:  Sinus rhythm Atrial premature complex Right axis  deviation Nonspecific T abnrm, anterolateral leads Similar to prior  Confirmed by Mingo Amber  MD, [place] ([id number]) on 03/28/2014 7:21:52 AM      MDM   Final diagnoses:  None   Given his family history and nature of pain, cardiac etiology is suspect, especially with improvement on ASA & nitroglycerin though EKG findings reassuring. Hemodynamically stable for now but will need to be admitted overnight for ACS rule out.      Charlott Rakes, MD 03/28/14 0900  Evelina Bucy, MD 03/28/14 [phone]

## 2014-03-28 NOTE — ED Notes (Signed)
Patient to go to cath lab before being admitted to floor.

## 2014-03-28 NOTE — Progress Notes (Signed)
ANTICOAGULATION CONSULT NOTE - Initial Consult  Pharmacy Consult for Heparin  Indication: chest pain/ACS  No Known Allergies  Patient Measurements: Height: 6\' 1"  (185.4 cm) Weight: 180 lb (81.647 kg) IBW/kg (Calculated) : 79.9 Heparin Dosing Weight: 82 kg   Vital Signs: Temp: 98.1 F (36.7 C) (12/01 0659) Temp Source: Oral (12/01 0659) BP: 129/62 mmHg (12/01 1107) Pulse Rate: 57 (12/01 1107)  Labs:  Recent Labs  03/28/14 0730 03/28/14 0900  HGB 15.6  --   HCT 45.5  --   PLT 259  --   CREATININE 0.94  --   TROPONINI  --  <0.30    Estimated Creatinine Clearance: 93.3 mL/min (by C-G formula based on Cr of 0.94).   Medical History: Past Medical History  Diagnosis Date  . Depression   . Diabetes mellitus without complication   . OA (osteoarthritis)     hip, back  . Tobacco abuse     Medications:   (Not in a hospital admission)  Assessment: [age] YOM with severe mid chest pain. ECG and troponins are ok. Pharmacy consulted to start heparin infusion for ACS. Pt was not on any anticoagulants prior to admission. CBC is wnl.    Goal of Therapy:  Heparin level 0.3-0.7 units/ml Monitor platelets by anticoagulation protocol: Yes   Plan:  -Heparin 4000 units bolus followed by heparin infusion at 950 units/hr  -F/u 6 hour HL -Monitor daily CBC, HL and s/s of bleeding   Vinnie LevelBenjamin Bilbo Carcamo, PharmD., BCPS Clinical Pharmacist Pager [phone]

## 2014-03-28 NOTE — Interval H&P Note (Signed)
History and Physical Interval Note:  03/28/2014 2:15 PM  Tony Lynch  has presented today for surgery, with the diagnosis of unstable angina The various methods of treatment have been discussed with the patient and family. After consideration of risks, benefits and other options for treatment, the patient has consented to  Procedure(s): LEFT HEART CATHETERIZATION WITH CORONARY ANGIOGRAM (N/A) ABDOMINAL AORTAGRAM (N/A) plus or minus PCI as a surgical intervention .  The patient's history has been reviewed, patient examined, no change in status, stable for surgery.  I have reviewed the patient's chart and labs.  Questions were answered to the patient's satisfaction.    Cath Lab Visit (complete for each Cath Lab visit)  Clinical Evaluation Leading to the Procedure:   ACS: Yes.    Non-ACS:    Anginal Classification: CCS IV  Anti-ischemic medical therapy: No Therapy  Non-Invasive Test Results: No non-invasive testing performed  Prior CABG: No previous CABG  AUC:  TIMI SCORE  Patient Information:  TIMI Score is 2  UA/NSTEMI and low-risk features (e.g., TIMI score  Revascularization of the presumed culprit artery   U (6)  Indication: 9; Score: 6   [name] W

## 2014-03-28 NOTE — Progress Notes (Signed)
Pt to cath lab 5

## 2014-03-28 NOTE — Plan of Care (Signed)
Problem: Consults Goal: PCI Patient Education (See Patient Education module for education specifics.) Outcome: Completed/Met Date Met:  03/28/14     

## 2014-03-29 DIAGNOSIS — I25110 Atherosclerotic heart disease of native coronary artery with unstable angina pectoris: Secondary | ICD-10-CM

## 2014-03-29 DIAGNOSIS — E118 Type 2 diabetes mellitus with unspecified complications: Secondary | ICD-10-CM

## 2014-03-29 LAB — BASIC METABOLIC PANEL
Anion gap: 13 (ref 5–15)
BUN: 10 mg/dL (ref 6–23)
CHLORIDE: 105 meq/L (ref 96–112)
CO2: 23 mEq/L (ref 19–32)
Calcium: 8.9 mg/dL (ref 8.4–10.5)
Creatinine, Ser: 0.87 mg/dL (ref 0.50–1.35)
GFR calc Af Amer: >90 mL/min (ref >90)
GFR calc non Af Amer: >90 mL/min (ref >90)
GLUCOSE: 122 mg/dL — AB (ref 70–99)
POTASSIUM: 4.4 meq/L (ref 3.7–5.3)
Sodium: 141 mEq/L (ref 137–147)

## 2014-03-29 LAB — GLUCOSE, CAPILLARY: GLUCOSE-CAPILLARY: 132 mg/dL — AB (ref 70–99)

## 2014-03-29 LAB — CBC
HCT: 44.6 % (ref 39.0–52.0)
Hemoglobin: 14.9 g/dL (ref 13.0–17.0)
MCH: 29.5 pg (ref 26.0–34.0)
MCHC: 33.4 g/dL (ref 30.0–36.0)
MCV: 88.3 fL (ref 78.0–100.0)
PLATELETS: 252 10*3/uL (ref 150–400)
RBC: 5.05 MIL/uL (ref 4.22–5.81)
RDW: 13.5 % (ref 11.5–15.5)
WBC: 8.3 10*3/uL (ref 4.0–10.5)

## 2014-03-29 LAB — TROPONIN I
Troponin I: 3.03 ng/mL (ref <0.30)
Troponin I: 2.51 ng/mL (ref <0.30)

## 2014-03-29 MED ORDER — PRASUGREL HCL 10 MG PO TABS
10.0000 mg | ORAL_TABLET | Freq: Every day | ORAL | Status: DC
Start: 2014-03-29 — End: 2014-04-13

## 2014-03-29 MED ORDER — NITROGLYCERIN 0.4 MG SL SUBL: 0.4000 mg | SUBLINGUAL_TABLET | SUBLINGUAL | Status: DC | PRN

## 2014-03-29 MED ORDER — ATORVASTATIN CALCIUM 80 MG PO TABS: 80.0000 mg | ORAL_TABLET | Freq: Every day | ORAL | Status: DC

## 2014-03-29 MED ORDER — ASPIRIN 81 MG PO TBEC: 81.0000 mg | DELAYED_RELEASE_TABLET | Freq: Every day | ORAL | Status: DC

## 2014-03-29 MED ORDER — PRASUGREL HCL 10 MG PO TABS: 10.0000 mg | ORAL_TABLET | Freq: Every day | ORAL | Status: DC

## 2014-03-29 MED ORDER — INFLUENZA VAC SPLIT QUAD 0.5 ML IM SUSY
0.5000 mL | PREFILLED_SYRINGE | Freq: Once | INTRAMUSCULAR | Status: AC
  Administered 2014-03-29: 12:00:00 0.5 mL via INTRAMUSCULAR
  Filled 2014-03-29: qty 0.5

## 2014-03-29 MED ORDER — METFORMIN HCL 500 MG PO TABS: ORAL_TABLET | ORAL | Status: DC

## 2014-03-29 MED FILL — Sodium Chloride IV Soln 0.9%: INTRAVENOUS | Qty: 50 | Status: AC

## 2014-03-29 NOTE — Discharge Summary (Signed)
Physician Discharge Summary  Patient ID: Tony Lynch MRN: [id number] DOB/AGE: Feb 02, 1951 63 y.o.   Primary Cardiologist: Dr. Ellyn Hack  Admit date: 03/28/2014 Discharge date: 03/29/2014  Admission Diagnoses: NSTEMI  Discharge Diagnoses:  Principal Problem:   Atherosclerotic heart disease of native coronary artery with unstable angina pectoris Active Problems:   Type 2 diabetes mellitus with complication - CAD   Depression   Smoking   Bradycardia   OA (osteoarthritis)   Unstable angina   Presence of drug coated stent in LAD coronary artery: Xience Alpine DES 2.75 mm x 18 mm (prox - 3.6 mm), 2.5 mm x 18 mm (~2.9 mm) mid   Discharged Condition: stable  Hospital Course: The patient is a 63 y/o male with a history of T2DM, tobacco abuse and a strong family history of CAD, who was admitted to [facility] on 03/28/14 with chest pain concerning for unstable angina. Cardiac enzymes were cycled and he ruled in for NSTEMI. He underwent a LHC, performed by Dr. Ellyn Hack. Access was achieved via the right radial artery. He was found to have LAD disease with 97% proximal stenosis and a 70% mid lesion. He underwent successful PCI utilizing DES for both lesions. He was also found to have residual CAD with moderate disease in the inferior branch of the lateral OM and mild to moderate disease in the RCA, for which medical therapy was recommended. Left ventriculography revealed normal systolic function with EF estimated at 60-65%. He tolerated the procedure well and was placed on DAPT with ASA + Effient. He left the cath lab in stable condition. He had no post cath complications. He denied recurrent CP and had no difficulties ambulating. In addition to DAPT, he was started on high dose stain therapy with 80 mg of Lipitor. A fasting lipid panel was notable for elevated LDL at 103. His target LDL goal will be < 70. He was also observered to have bradycardia on telemetry with rates ranging from the 30s-50s resting.  However, he remained completely asymptomatic, denying lightheadedness, dizziness, syncope/ near syncope. For this reason, he was not place on a BB. His BP remained stable, as did renal function. Metformin was placed on hold post cath and patient was ordered to hold for an additional 36 hrs post discharge. The patient's right radial access site remained stable with a 2+ radial pulse. He had no difficulties ambulating with cardiac rehab. He was last seen and examined by Dr. Angelena Form, who determined he was stable for discharge home. He is scheduled to follow-up with Lyda Jester, PA-C, on 04/13/14. He will be followed long term by Dr. Ellyn Hack. Smoking cessation was strongly advised on discharge. He was provided a free 30 day Effient card.    Consults: None  Significant Diagnostic Studies:   LHC 03/28/14  Hemodynamics:   Central Aortic Pressure / Mean: 124/59/84 mmHg  Left Ventricular Pressure / LVEDP: 120/0/6 mmHg  Left Ventriculography:  EF: 60-65 %  Wall Motion: Normal  Coronary Anatomy:  Dominance: Right  Left Main: Large-caliber vessel that bifurcates proximally to the Circumflex and LAD. Angiographically normal. LAD: Large-caliber vessel that has proximal tapering with a 95% focal stenosis at the takeoff of septal perforator 1 and a bifurcating first diagonal branch that is small in diameter. There is then a 70% tubular lesion in the mid LAD. The LAD then wraps down around the apex perfusing the distal inferoapex. There is mild 30-40% disease apically.  D1: Small caliber very proximal vessel that bifurcates proximally into 2 small caliber vessels.  Mild diffuse luminal irregularities. Not involved with proximal LAD stenosis.  Left Circumflex: Large caliber, nondominant vessel it gives off a small AV groove branch before terminates as a large bifurcating lateral OM. The superior branch is larger in caliber and bifurcates distally. The inferior branch is moderate caliber  and has a tubular 60% stenosis before it bifurcates into small caliber ramus.   RCA: Large-caliber, dominant vessel with proximal 30% stenosis with a downward takeoff. There is in a tapered 40-50% mid lesion that is relatively long before normalizes again. It then bifurcates distally into the Right Posterior Descending Artery (RPDA) and the Right Posterior AV Groove Branch (RPAV).  RPDA: Moderate caliber vessel which reaches almost to the apex. There is a ostial 30% stenosis.  RPL Sysytem:The RPAV begins as a moderate caliber vessel bifurcates into 2 small moderate caliber posterior lateral branches.   Treatments: See Hospital Course  Discharge Exam: Blood pressure 103/66, pulse 42, temperature 98.2 F (36.8 C), temperature source Oral, resp. rate 20, height $RemoveBe'6\' 1"'UbuoTHSHW$  (1.854 m), weight 180 lb 12.4 oz (82 kg), SpO2 96 %.   Disposition:       Discharge Instructions    Diet - low sodium heart healthy    Complete by:  As directed      Discharge instructions    Complete by:  As directed   Wait until 03/31/14 to resume Metformin     Driving Restrictions    Complete by:  As directed   No driving for 3 days     Increase activity slowly    Complete by:  As directed      Lifting restrictions    Complete by:  As directed   No lifting more than 1/2 gallon of milk for 3 days            Medication List    STOP taking these medications        traMADol 50 MG tablet  Commonly known as:  ULTRAM      TAKE these medications        amitriptyline 25 MG tablet  Commonly known as:  ELAVIL  Take 1 tablet (25 mg total) by mouth at bedtime.     aspirin 81 MG EC tablet  Take 1 tablet (81 mg total) by mouth daily.     atorvastatin 80 MG tablet  Commonly known as:  LIPITOR  Take 1 tablet (80 mg total) by mouth daily at 6 PM.     cyclobenzaprine 5 MG tablet  Commonly known as:  FLEXERIL  Take 1 tablet (5 mg total) by mouth 3 (three) times daily as needed for muscle spasms.      escitalopram 10 MG tablet  Commonly known as:  LEXAPRO  Take 1 tablet (10 mg total) by mouth daily.     glucose monitoring kit monitoring kit  1 each by Does not apply route as needed for other. Dispense any model that is covered- dispense testing supplies for Q AC/ HS accuchecks- 1 month supply with one refil.     metFORMIN 500 MG tablet  Commonly known as:  GLUCOPHAGE  TAKE 1 TABLET BY MOUTH TWICE A DAY WITH A MEAL  Start taking on:  03/31/2014     nicotine 21 mg/24hr patch  Commonly known as:  NICODERM CQ  Place 1 patch (21 mg total) onto the skin daily.     nitroGLYCERIN 0.4 MG SL tablet  Commonly known as:  NITROSTAT  Place 1 tablet (0.4 mg total) under  the tongue every 5 (five) minutes as needed for chest pain.     prasugrel 10 MG Tabs tablet  Commonly known as:  EFFIENT  Take 1 tablet (10 mg total) by mouth daily.     prasugrel 10 MG Tabs tablet  Commonly known as:  EFFIENT  Take 1 tablet (10 mg total) by mouth daily.       Follow-up Information    Follow up with Lyda Jester, PA-C On 04/13/2014.   Specialty:  Cardiology   Why:  2:00 pm (Dr. Allison Quarry PA)   Contact information:   [address] [place] [address] [phone]     [place], INCLUDING PHYSICIAN TIME: >30 MINUTES  Signed: Lyda Jester 03/29/2014, 8:14 AM

## 2014-03-29 NOTE — Progress Notes (Signed)
Patient Profile: 63 y.o male with h/o DM2, ongoing tobacco abuse and strong FHx CAD admitted for CP and ruled in for NSTEMI.   Subjective: No complaints. Denies any recurrent CP. No dyspnea. He is completely asymptomatic with his bradycardia, denying lightheadedness, dizziness, syncope/ near syncope. No difficulties with ambulation. Right radial access site w/o complication.   Objective: Vital signs in last 24 hours: Temp:  [97.9 F (36.6 C)-98.2 F (36.8 C)] 98.2 F (36.8 C) (12/02 0515) Pulse Rate:  [40-77] 42 (12/02 0515) Resp:  [10-20] 20 (12/02 0515) BP: (103-149)/(47-71) 103/66 mmHg (12/02 0515) SpO2:  [91 %-100 %] 96 % (12/02 0515) Weight:  [180 lb (81.647 kg)-180 lb 12.4 oz (82 kg)] 180 lb 12.4 oz (82 kg) (12/02 0033) Last BM Date: 03/28/14  Intake/Output from previous day: 12/01 0701 - 12/02 0700 In: 564.4 [I.V.:564.4] Out: 2300 [Urine:2300] Intake/Output this shift: Total I/O In: 326.4 [I.V.:326.4] Out: 1650 [Urine:1650]  Medications Current Facility-Administered Medications  Medication Dose Route Frequency Provider Last Rate Last Dose  . acetaminophen (TYLENOL) tablet 650 mg  650 mg Oral Q6H PRN Annett Gularacy N McLean, MD      . aspirin EC tablet 81 mg  81 mg Oral Daily Annett Gularacy N McLean, MD      . atorvastatin (LIPITOR) tablet 80 mg  80 mg Oral q1800 Marykay Lexavid W Harding, MD   80 mg at 03/28/14 1900  . escitalopram (LEXAPRO) tablet 10 mg  10 mg Oral Daily Aletta EdouardShilpa Bhardwaj, MD      . insulin aspart (novoLOG) injection 0-9 Units  0-9 Units Subcutaneous TID WC Annett Gularacy N McLean, MD   0 Units at 03/28/14 1845  . morphine 2 MG/ML injection 2 mg  2 mg Intravenous Q1H PRN Marykay Lexavid W Harding, MD      . nicotine (NICODERM CQ - dosed in mg/24 hours) patch 21 mg  21 mg Transdermal Daily Annett Gularacy N McLean, MD   21 mg at 03/28/14 2112  . nitroGLYCERIN (NITROSTAT) SL tablet 0.4 mg  0.4 mg Sublingual Q5 min PRN Hurman HornJohn M Bednar, MD      . pneumococcal 23 valent vaccine (PNU-IMMUNE) injection 0.5 mL  0.5 mL  Intramuscular Tomorrow-1000 Marykay Lexavid W Harding, MD      . prasugrel (EFFIENT) tablet 10 mg  10 mg Oral Daily Marykay Lexavid W Harding, MD        PE: General appearance: alert, cooperative and no distress Neck: no JVD Lungs: clear to auscultation bilaterally Heart: brady with regular rhythm Extremities: no LEE Pulses: 2+ and symmetric Skin: warm and dry Neurologic: Grossly normal  Lab Results:   Recent Labs  03/28/14 0730 03/29/14 0441  WBC 8.5 8.3  HGB 15.6 14.9  HCT 45.5 44.6  PLT 259 252   BMET  Recent Labs  03/28/14 0730 03/29/14 0441  NA 139 141  K 4.9 4.4  CL 100 105  CO2 26 23  GLUCOSE 154* 122*  BUN 9 10  CREATININE 0.94 0.87  CALCIUM 9.3 8.9   PT/INR  Recent Labs  03/28/14 1240  LABPROT 14.1  INR 1.08   Lipid Panel     Component Value Date/Time   CHOL 161 03/28/2014 0900   TRIG 135 03/28/2014 0900   HDL 31* 03/28/2014 0900   CHOLHDL 5.2 03/28/2014 0900   VLDL 27 03/28/2014 0900   LDLCALC 103* 03/28/2014 0900     Cardiac Panel (last 3 results)  Recent Labs  03/28/14 0900 03/28/14 2035 03/29/14 0441  TROPONINI <0.30 2.22* 3.03*    Studies/Results:  LHC 03/28/14  Hemodynamics:   Central Aortic Pressure / Mean: 124/59/84 mmHg  Left Ventricular Pressure / LVEDP: 120/0/6 mmHg  Left Ventriculography:  EF: 60-65 %  Wall Motion: Normal  Coronary Anatomy:  Dominance: Right  Left Main: Large-caliber vessel that bifurcates proximally to the Circumflex and LAD. Angiographically normal. LAD: Large-caliber vessel that has proximal tapering with a 95% focal stenosis at the takeoff of septal perforator 1 and a bifurcating first diagonal branch that is small in diameter. There is then a 70% tubular lesion in the mid LAD. The LAD then wraps down around the apex perfusing the distal inferoapex. There is mild 30-40% disease apically.  D1: Small caliber very proximal vessel that bifurcates proximally into 2 small caliber vessels. Mild diffuse  luminal irregularities. Not involved with proximal LAD stenosis.  Left Circumflex: Large caliber, nondominant vessel it gives off a small AV groove branch before terminates as a large bifurcating lateral OM. The superior branch is larger in caliber and bifurcates distally. The inferior branch is moderate caliber and has a tubular 60% stenosis before it bifurcates into small caliber ramus.   RCA: Large-caliber, dominant vessel with proximal 30% stenosis with a downward takeoff. There is in a tapered 40-50% mid lesion that is relatively long before normalizes again. It then bifurcates distally into the Right Posterior Descending Artery (RPDA) and the Right Posterior AV Groove Branch (RPAV).  RPDA: Moderate caliber vessel which reaches almost to the apex. There is a ostial 30% stenosis.  RPL Sysytem:The RPAV begins as a moderate caliber vessel bifurcates into 2 small moderate caliber posterior lateral branches.  Assessment/Plan  Principal Problem:   Atherosclerotic heart disease of native coronary artery with unstable angina pectoris Active Problems:   Type 2 diabetes mellitus with complication - CAD   Depression   Smoking   Bradycardia   OA (osteoarthritis)   Unstable angina   Presence of drug coated stent in LAD coronary artery: Xience Alpine DES 2.75 mm x 18 mm (prox - 3.6 mm), 2.5 mm x 18 mm (~2.9 mm) mid   1. NSTEMI/CAD: s/p PCI + DES of 97% proximal and 70% mid LAD lesions. Also with moderate disease in the inferior branch of the lateral OM and mild to moderate disease in the RCA. EF normal at 60-65%. No recurrent CP. Ambulating w/o difficulty. Continue DAPT w/ ASA + Effient for a minimum of 1 year for newly placed DES. Continue high dose statin. No BB due to bradycardia. BP too soft to start ACE. Reassess as OP. Continue lifestyle modification/ risk factor reduction for secondary prevention. ? rechecking troponin to ensure levels are trending downward, although the slight elevation  from 2-3 may be from cath.   2. HLD: LDL elevated at 103. Ideal goal is <70. Continue high dose Lipitor. Recheck fasting lipid panel and LFTs in 6 weeks.   3. Tobacco abuse: Smoking cessation strongly advised. Continue with Nicoderm CQ.  4. DM: Hgb A1C at goal at 6.9. Will hold Metformin for 48 hrs post cath. Continue [place] insulin.  5. Bradycardia: HR in the 30s- 50s resting and Increases into the 70s with ambulation. He has remained completely asymptomatic. Will hold initiation of BB. He is on no other AV nodal blocking agents.   6. Post Cath: stable with 2+ right radial pulse. No access site complications. Renal function stable. Hold Metformin for 48 hrs post cath.     LOS: 1 day    Brittainy M. Delmer IslamSimmons, PA-C 03/29/2014 6:24 AM  I have personally  seen and examined this patient with Robbie Lis, PA-C. I agree with the assessment and plan as outlined above. He was admitted with a NSTEMI and is now s/p DES x 2 proximal and mid LAD. He is on ASA, Effient and high dose statin. Doing well this am. No beta blocker with bradycardia. No Ace-inh with relative hypotension. OK to discharge home today.  He will follow up with Dr. Herbie Baltimore. Would plan f/u with Dr. Herbie Baltimore or [facility] office APP in 1-2 weeks.   Martavia Tye 03/29/2014 7:00 AM

## 2014-03-29 NOTE — Progress Notes (Signed)
Patient's care beginning with his cardiac catheterization was assumed by the cardiology service.

## 2014-03-29 NOTE — Progress Notes (Signed)
[id number] Pt off telemetry and had walked already so did not walk with pt. Denied CP with walk. MI education completed with pt who voiced understanding. Gave smoking cessation handout and fake cigarette. Encouraged pt to call [email or web address] as needed to talk with coach. Pt stated he has tried before and had not had success but going to try again. Discussed healthy food choices and cutting down on carbs. Pt drinks 2L regular coke a day. Encouraged pt to try to at least half with diet to try to decrease sugar intake as he stated he could not give up smoking and drinking coke. Pt receptive and pleasant to trying to make changes. Discussed CRP 2 but pt declined. Wants to walk on his own for ex. Gave pt effient booklet and stressed importance of effient with stent. Observed pt walking as I was writing note and no difficultly. Luetta Nuttingharlene Polly Barner RN BSN 03/29/2014 9:50 AM

## 2014-03-31 ENCOUNTER — Telehealth: Payer: Self-pay | Admitting: Cardiology

## 2014-03-31 NOTE — Telephone Encounter (Signed)
Pt called in stating that Tony Lynch put him on Atorvastatin and he can not afford it. He would like to know if there is an another drug he can take in the place of that. He was also inquiring about when he can go back to work. Please call  Thanks

## 2014-03-31 NOTE — Telephone Encounter (Signed)
Left message for pt to call.

## 2014-04-03 ENCOUNTER — Telehealth: Payer: Self-pay | Admitting: Cardiology

## 2014-04-03 NOTE — Telephone Encounter (Signed)
Close encounter 

## 2014-04-06 ENCOUNTER — Encounter (HOSPITAL_COMMUNITY): Payer: Self-pay | Admitting: Cardiology

## 2014-04-13 ENCOUNTER — Ambulatory Visit (INDEPENDENT_AMBULATORY_CARE_PROVIDER_SITE_OTHER): Payer: Self-pay | Admitting: Cardiology

## 2014-04-13 ENCOUNTER — Encounter: Payer: Self-pay | Admitting: Cardiology

## 2014-04-13 VITALS — BP 130/60 | HR 75 | Ht 73.0 in | Wt 183.5 lb

## 2014-04-13 DIAGNOSIS — I2510 Atherosclerotic heart disease of native coronary artery without angina pectoris: Secondary | ICD-10-CM

## 2014-04-13 MED ORDER — NICOTINE 14 MG/24HR TD PT24: 14.0000 mg | MEDICATED_PATCH | Freq: Every day | TRANSDERMAL | Status: DC

## 2014-04-13 MED ORDER — ROSUVASTATIN CALCIUM 20 MG PO TABS: 20.0000 mg | ORAL_TABLET | Freq: Every day | ORAL | Status: DC

## 2014-04-13 NOTE — Progress Notes (Signed)
04/13/2014 Tony Lynch   1950/11/06  [id number]  Primary Physician Lorayne Marek, MD Primary Cardiologist: Dr. Ellyn Hack  HPI:  The patient is a 63 y/o male with a history of T2DM, tobacco abuse and a strong family history of CAD, who was admitted to [facility] on 03/28/14 with chest pain concerning for unstable angina. Cardiac enzymes were cycled and he ruled in for NSTEMI. He underwent a LHC, performed by Dr. Ellyn Hack. Access was achieved via the right radial artery. He was found to have LAD disease with 97% proximal stenosis and a 70% mid lesion. He underwent successful PCI utilizing DES for both lesions. He was also found to have residual CAD with moderate disease in the inferior branch of the lateral OM and mild to moderate disease in the RCA, for which medical therapy was recommended. Left ventriculography revealed normal systolic function with EF estimated at 60-65%. He tolerated the procedure well and was placed on DAPT with ASA + Effient. He had no post cath complications. In addition to DAPT, he was started on high dose stain therapy with 80 mg of Lipitor. A fasting lipid panel was notable for elevated LDL at 103. His target LDL goal is < 70. He was also observered to have bradycardia on telemetry with rates ranging from the 30s-50s resting. However, he remained completely asymptomatic, denying lightheadedness, dizziness, syncope/ near syncope. For this reason, he was not place on a BB.   He presents to clinic today for post-hospital follow-up. He is accompanied by his sister. He states that since discharge, he has done fairly well. He denies any recurrent anginal symptoms. He has returned to light physical activity without limitations. He played a round of golf the other day and denies any exertional chest discomfort or dyspnea. He reports full medication compliance with aspirin and Effient. He did receive the Effient discount card and is pleased to receive a years supply of Effient for free.  His  only concern is regarding the cost of his Lipitor. For some reason, his the co-pay is 100+ per month. He reports that he has confirmed this with his insurance company. He also notes that he has discontinued tobacco use with the use of nicotine patches. This has craved has urge for cigarettes.   Current Outpatient Prescriptions  Medication Sig Dispense Refill  . aspirin EC 81 MG EC tablet Take 1 tablet (81 mg total) by mouth daily.    Marland Kitchen atorvastatin (LIPITOR) 80 MG tablet Take 1 tablet (80 mg total) by mouth daily at 6 PM. 30 tablet 5  . escitalopram (LEXAPRO) 10 MG tablet Take 1 tablet (10 mg total) by mouth daily. 30 tablet 3  . glucose monitoring kit (FREESTYLE) monitoring kit 1 each by Does not apply route as needed for other. Dispense any model that is covered- dispense testing supplies for Q AC/ HS accuchecks- 1 month supply with one refil. 1 each 1  . metFORMIN (GLUCOPHAGE) 500 MG tablet TAKE 1 TABLET BY MOUTH TWICE A DAY WITH A MEAL 60 tablet 2  . nicotine (NICODERM CQ) 21 mg/24hr patch Place 1 patch (21 mg total) onto the skin daily. 28 patch 0  . nitroGLYCERIN (NITROSTAT) 0.4 MG SL tablet Place 1 tablet (0.4 mg total) under the tongue every 5 (five) minutes as needed for chest pain. 25 tablet 2  . prasugrel (EFFIENT) 10 MG TABS tablet Take 1 tablet (10 mg total) by mouth daily. 30 tablet 10   No current facility-administered medications for this visit.  No Known Allergies  History   Social History  . Marital Status: Divorced    Spouse Name: N/A    Number of Children: N/A  . Years of Education: N/A   Occupational History  . Not on file.   Social History Main Topics  . Smoking status: Former Smoker -- 1.50 packs/day for 46 years    Types: Cigarettes  . Smokeless tobacco: Never Used  . Alcohol Use: No  . Drug Use: No  . Sexual Activity: Not on file   Other Topics Concern  . Not on file   Social History Narrative   Smoker 1.5 ppd x 50 years    Lives alone    2 kids,  divorced    Works at [employer] full time    5/6 siblings alive               Review of Systems: General: negative for chills, fever, night sweats or weight changes.  Cardiovascular: negative for chest pain, dyspnea on exertion, edema, orthopnea, palpitations, paroxysmal nocturnal dyspnea or shortness of breath Dermatological: negative for rash Respiratory: negative for cough or wheezing Urologic: negative for hematuria Abdominal: negative for nausea, vomiting, diarrhea, bright red blood per rectum, melena, or hematemesis Neurologic: negative for visual changes, syncope, or dizziness All other systems reviewed and are otherwise negative except as noted above.    Blood pressure 130/60, pulse 75, height _0  (1.854 m), weight 183 lb 8 oz (83.235 kg).   General appearance: alert, cooperative and no distress Neck: no carotid bruit and no JVD Lungs: clear to auscultation bilaterally Heart: regular rate and rhythm, S1, S2 normal, no murmur, click, rub or gallop Extremities: no LEE Pulses: 2+ and symmetric Skin: warm and dry Neurologic: Grossly normal   ASSESSMENT AND PLAN:   1. CAD: status post PCI of the LAD with residual RCA and lateral OM1 disease. He denies recurrent anginal symptoms. Continue dual antiplatelet therapy with aspirin plus Effient. This will need to be continued for a minimum of one year given drug-eluting stent placement. Continue aggressive risk factor reduction for secondary prevention. Continue statin therapy for hyperlipidemia as well as metformin for control of his diabetes. Continue avoidance of tobacco use. No beta blocker therapy due to history of severe bradycardia.  2. Hyperlipidemia: patient is currently on 80 mg of Lipitor, however for insurance reasons his co-pay is extremely high ( I find this to be unusual for Lipitor, however the patient reports that he confirmed this with his insurance company). He has Pharmacist, community. I recommended transition  to Crestor. Will prescribe 20 mg daily.  Will provide free samples, as a trial, to see if he will tolerate this without side effects. We will also give him the discount card ( 3 month supply for $3, valid or 14 months). I suggested that we recheck a fasting lipid panel when he follows up with Dr. Ellyn Hack in 2 months.  3. Hypertension: controlled at 130/60. No change in medical regimen.  4. Diabetes: on metformin therapy. Followed by PCP.  5. Tobacco cessation: the patient was congratulated on his efforts and was encouraged to continue to refrain from tobacco use.  6. Bradycardia: The patient was documented to have severe bradycardia on telemetry during his recent hospitalization with heart rates as low as 30 to 40s. His heart rate today in clinic is in the 66s. However given the severity of his bradycardia recently, we'll continue to avoid beta blocker therapy for now. Will defer to Dr. Ellyn Hack at  his next office visit.    PLAN  The patient appears to be doing well from a cardiovascular standpoint. He was instructed to return for follow-up to see Dr. Ellyn Hack in 2-3 months.  Ethylene Reznick, BRITTAINYPA-C 04/13/2014 2:25 PM

## 2014-04-13 NOTE — Patient Instructions (Signed)
Please follow up with Dr. Herbie BaltimoreHarding in 2 months.  STOP taking Lipitor  START Crestor 20 MG, once a day (21 sample tablets given)

## 2014-04-27 ENCOUNTER — Telehealth: Payer: Self-pay | Admitting: Cardiology

## 2014-04-27 NOTE — Telephone Encounter (Signed)
Was given a coupon for Effient for a year supply , use the coupon once on Dec 2,2015  And went to get a refill and the pharmacy states that the coupon is no good. The coupon is suppose to be good for 12mth . Please call    Thanks

## 2014-04-27 NOTE — Telephone Encounter (Signed)
Returned call to patient he stated effient savings card is no longer any good.Stated he needed samples until his new insurance starts 04/28/14.Effient 10 mg samples left at front desk of [facility] office.

## 2014-05-09 ENCOUNTER — Telehealth: Payer: Self-pay | Admitting: Cardiology

## 2014-05-09 MED ORDER — ROSUVASTATIN CALCIUM 20 MG PO TABS: 20.0000 mg | ORAL_TABLET | Freq: Every day | ORAL | Status: DC

## 2014-05-09 NOTE — Telephone Encounter (Signed)
Pt called in stating that he lost his prescription for his Crestor and would like a new one called in to the [facility] on [address]  thanks

## 2014-05-09 NOTE — Telephone Encounter (Signed)
Rx(s) sent to pharmacy electronically.  

## 2014-06-02 ENCOUNTER — Encounter: Payer: Self-pay | Admitting: Family Medicine

## 2014-06-02 ENCOUNTER — Ambulatory Visit (INDEPENDENT_AMBULATORY_CARE_PROVIDER_SITE_OTHER): Payer: 59 | Admitting: Family Medicine

## 2014-06-02 VITALS — BP 132/68 | HR 44 | Temp 98.0°F | Ht 73.0 in | Wt 183.0 lb

## 2014-06-02 DIAGNOSIS — F32A Depression, unspecified: Secondary | ICD-10-CM

## 2014-06-02 DIAGNOSIS — E119 Type 2 diabetes mellitus without complications: Secondary | ICD-10-CM

## 2014-06-02 DIAGNOSIS — Z720 Tobacco use: Secondary | ICD-10-CM

## 2014-06-02 DIAGNOSIS — Z23 Encounter for immunization: Secondary | ICD-10-CM

## 2014-06-02 DIAGNOSIS — E118 Type 2 diabetes mellitus with unspecified complications: Secondary | ICD-10-CM

## 2014-06-02 DIAGNOSIS — I2510 Atherosclerotic heart disease of native coronary artery without angina pectoris: Secondary | ICD-10-CM

## 2014-06-02 DIAGNOSIS — F17200 Nicotine dependence, unspecified, uncomplicated: Secondary | ICD-10-CM

## 2014-06-02 DIAGNOSIS — F329 Major depressive disorder, single episode, unspecified: Secondary | ICD-10-CM

## 2014-06-02 MED ORDER — ESCITALOPRAM OXALATE 10 MG PO TABS: 10.0000 mg | ORAL_TABLET | Freq: Every day | ORAL | Status: DC

## 2014-06-02 NOTE — Progress Notes (Signed)
Pre visit review using our clinic review tool, if applicable. No additional management support is needed unless otherwise documented below in the visit note.  New patient.  CAD.  No known hx until admitted with MI.  Stented with relief of sx and now to est with PCP.  Will have cards f/u.  No CP, not SOB, back to working. No BLE.  Feels well.  Stopping smoking.  Compliant with meds.    DM2.  H/o, dx 2015.  Due for f/u labs later in 2016.  D/w pt about Dm2 diet and path/phys, along with contribution to CAD risk.  Compliant with meds.  Cutting back on soda, down to 32oz coke per day, prev much more per patient.   R shoulder.  H/o L shoulder pain, prev went to PT for cuff sx on L side.  Pain with overhead movement, reaching.  No specific trauma but has a physical job, has has physical job for years.   H/o depression for years, well controlled with SSRI, no ADE, complaint. No Si/Hi, wants to continue med.    PMH and SH reviewed  ROS: See HPI, otherwise noncontributory.  Meds, vitals, and allergies reviewed.   GEN: nad, alert and oriented HEENT: mucous membranes moist NECK: supple w/o LA CV: rrr.  no murmur PULM: ctab, no inc wob ABD: soft, +bs EXT: no edema SKIN: no acute rash R shoulder with normal rom but pain with overhead movement, + impingement.  Pain with int> ext rotation, + scap assist.  No AC pain on testing, no arm drop. Distally nv intact.

## 2014-06-02 NOTE — Patient Instructions (Signed)
Take care.  Glad to see you.   Use the shoulder exercises and that should help.  Let me know if you aren't getting better.  Recheck labs (fasting) at a a lab visit in middle of March.  We'll go from there.  Let's plan on getting back together in about 6 months.

## 2014-06-04 NOTE — Assessment & Plan Note (Signed)
He is in the process of quitting with replacement and should do well, is motivated.

## 2014-06-04 NOTE — Assessment & Plan Note (Signed)
He'll continue as is with meds, will continue to cut back on soda. Recheck labs in 06/2014.  D/w pt.  We'll work on DM2 health maintenance as we go along.  He agrees.  D/w pt about diet and exercise and path phys.

## 2014-06-04 NOTE — Assessment & Plan Note (Signed)
Controlled, he'll continue SSRI, doing well per patient report.  No reason to stop and good reason to continue, he agrees.

## 2014-06-04 NOTE — Assessment & Plan Note (Signed)
Now w/o angina, doing well.  D/w pt rationale for current meds.  Recheck labs later in 06/2014.  He agrees. >30 minutes spent in face to face time with patient, >50% spent in counselling or coordination of care.

## 2014-06-27 ENCOUNTER — Encounter: Payer: Self-pay | Admitting: Cardiology

## 2014-06-27 ENCOUNTER — Ambulatory Visit (INDEPENDENT_AMBULATORY_CARE_PROVIDER_SITE_OTHER): Payer: 59 | Admitting: Cardiology

## 2014-06-27 VITALS — BP 138/70 | HR 55 | Ht 73.0 in | Wt 180.5 lb

## 2014-06-27 DIAGNOSIS — Z955 Presence of coronary angioplasty implant and graft: Secondary | ICD-10-CM

## 2014-06-27 DIAGNOSIS — I200 Unstable angina: Secondary | ICD-10-CM

## 2014-06-27 DIAGNOSIS — R011 Cardiac murmur, unspecified: Secondary | ICD-10-CM

## 2014-06-27 DIAGNOSIS — Z9861 Coronary angioplasty status: Secondary | ICD-10-CM

## 2014-06-27 DIAGNOSIS — E785 Hyperlipidemia, unspecified: Secondary | ICD-10-CM

## 2014-06-27 DIAGNOSIS — I10 Essential (primary) hypertension: Secondary | ICD-10-CM

## 2014-06-27 DIAGNOSIS — R0989 Other specified symptoms and signs involving the circulatory and respiratory systems: Secondary | ICD-10-CM

## 2014-06-27 DIAGNOSIS — R001 Bradycardia, unspecified: Secondary | ICD-10-CM

## 2014-06-27 DIAGNOSIS — E118 Type 2 diabetes mellitus with unspecified complications: Secondary | ICD-10-CM

## 2014-06-27 DIAGNOSIS — I2510 Atherosclerotic heart disease of native coronary artery without angina pectoris: Secondary | ICD-10-CM

## 2014-06-27 DIAGNOSIS — Z87891 Personal history of nicotine dependence: Secondary | ICD-10-CM

## 2014-06-27 DIAGNOSIS — Z79899 Other long term (current) drug therapy: Secondary | ICD-10-CM

## 2014-06-27 HISTORY — PX: TRANSTHORACIC ECHOCARDIOGRAM: SHX275

## 2014-06-27 NOTE — Patient Instructions (Signed)
Your physician has requested that you have an echocardiogram. Echocardiography is a painless test that uses sound waves to create images of your heart. It provides your doctor with information about the size and shape of your heart and how well your heart's chambers and valves are working. This procedure takes approximately one hour. There are no restrictions for this procedure.  Your physician has requested that you have a carotid duplex. This test is an ultrasound of the carotid arteries in your neck. It looks at blood flow through these arteries that supply the brain with blood. Allow one hour for this exam. There are no restrictions or special instructions.  Your physician recommends  lab work in lipid ,CMP   Your physician wants you to follow-up in July 2016 DR HARDING. You will receive a reminder letter in the mail two months in advance. If you don't receive a letter, please call our office to schedule the follow-up appointment.

## 2014-06-29 ENCOUNTER — Encounter: Payer: Self-pay | Admitting: Cardiology

## 2014-06-29 ENCOUNTER — Ambulatory Visit (HOSPITAL_BASED_OUTPATIENT_CLINIC_OR_DEPARTMENT_OTHER)
Admission: RE | Admit: 2014-06-29 | Discharge: 2014-06-29 | Disposition: A | Discharge status: Home / Self Care | Payer: 59 | Source: Ambulatory Visit | Attending: Cardiology | Admitting: Cardiology

## 2014-06-29 ENCOUNTER — Ambulatory Visit (HOSPITAL_COMMUNITY)
Admission: RE | Admit: 2014-06-29 | Discharge: 2014-06-29 | Disposition: A | Discharge status: Home / Self Care | Payer: 59 | Source: Ambulatory Visit | Attending: Cardiology | Admitting: Cardiology

## 2014-06-29 DIAGNOSIS — I2510 Atherosclerotic heart disease of native coronary artery without angina pectoris: Secondary | ICD-10-CM | POA: Diagnosis not present

## 2014-06-29 DIAGNOSIS — Z955 Presence of coronary angioplasty implant and graft: Secondary | ICD-10-CM

## 2014-06-29 DIAGNOSIS — E785 Hyperlipidemia, unspecified: Secondary | ICD-10-CM | POA: Diagnosis not present

## 2014-06-29 DIAGNOSIS — R001 Bradycardia, unspecified: Secondary | ICD-10-CM

## 2014-06-29 DIAGNOSIS — Z79899 Other long term (current) drug therapy: Secondary | ICD-10-CM

## 2014-06-29 DIAGNOSIS — R011 Cardiac murmur, unspecified: Secondary | ICD-10-CM

## 2014-06-29 DIAGNOSIS — Z720 Tobacco use: Secondary | ICD-10-CM | POA: Diagnosis not present

## 2014-06-29 DIAGNOSIS — R0989 Other specified symptoms and signs involving the circulatory and respiratory systems: Secondary | ICD-10-CM

## 2014-06-29 DIAGNOSIS — I6521 Occlusion and stenosis of right carotid artery: Secondary | ICD-10-CM | POA: Insufficient documentation

## 2014-06-29 DIAGNOSIS — E119 Type 2 diabetes mellitus without complications: Secondary | ICD-10-CM | POA: Diagnosis not present

## 2014-06-29 DIAGNOSIS — I10 Essential (primary) hypertension: Secondary | ICD-10-CM | POA: Insufficient documentation

## 2014-06-29 NOTE — Assessment & Plan Note (Addendum)
No recurrent anginal symptoms. EF was normal by LV gram. We will be other reassess this with the echocardiogram is ordered for evaluation of the murmur.  With negative troponins, I don't suspect that there would be any significant wall motion abnormalities..Marland Kitchen

## 2014-06-29 NOTE — Assessment & Plan Note (Signed)
Soft murmur in what appears to be the aortic valve region. Plan: 2-D echocardiogram to obtain baseline on valvular function as well as EF.

## 2014-06-29 NOTE — Assessment & Plan Note (Addendum)
This could either be radiated murmur or bruit   plan: Carotid Dopplers.

## 2014-06-29 NOTE — Assessment & Plan Note (Addendum)
Not at goal. Converted to Crestor during last visit. Continue at current dose Will be due for lab check in the next month. Lipid panel and LFTs ordered

## 2014-06-29 NOTE — Assessment & Plan Note (Signed)
LFTs and lipid panel for statin therapy. In order to consider safety of using ACE inhibitor, will check chemistry panel as well.

## 2014-06-29 NOTE — Progress Notes (Signed)
Quick Note:  Echo results: Good news: Essentially normal echocardiogram and normal pump function and normal valve function. No regional wall motion abnormalities - No signs to suggest heart attack.. EF: 60-65%.  HARDING, DAVID W, MD     ______ 

## 2014-06-29 NOTE — Progress Notes (Signed)
2D Echocardiogram Complete.  06/29/2014   Farrel ConnersBethany Ciria Bernardini, RDCS   Preliminary Technician Findings:  This was a technically difficult study.  [name] was in a Bigeminy Rhythm during the exam and has narrow rib spacing and lung interference in the Parasternal windows.

## 2014-06-29 NOTE — Progress Notes (Signed)
Carotid Duplex Completed. °Brianna L Mazza,RVT °

## 2014-06-29 NOTE — Assessment & Plan Note (Signed)
With 2 DES stents in the LAD. I would continue with dual antiplatelet therapy for one year and then would continue with either Effient or Plavix (dated would be better with Effient) indefinitely.  Based on relevant data, could likely suspend Effient use for several days if needed for procedure after 3-6 months. Okay to convert to Plavix if necessary.

## 2014-06-29 NOTE — Assessment & Plan Note (Signed)
Monitored by PCP. He is on metformin.

## 2014-06-29 NOTE — Assessment & Plan Note (Signed)
Doing well. Is now beyond the stage of craving. No longer requiring patches.

## 2014-06-29 NOTE — Assessment & Plan Note (Signed)
Blood pressure is well-controlled today. He is not currently on any medications. As indicated above, if necessary would use ACE inhibitor.

## 2014-06-29 NOTE — Progress Notes (Signed)
PCP: Crawford Givens, MD  Clinic Note: Chief Complaint  Patient presents with  . 12 month visit    felt bad yesterdayjust all over ,no chest discomfort, no sob , no edema  . Coronary Artery Disease   HPI: Tony Lynch is a 64 y.o. male with a PMH below who presents today for second hospital follow-up from non-ST elevation MI status post PCI.  He was admitted on March 28, 2014 with unstable angina. He is brought to the cardiac catheterization lab and was found to have significant to site segmental disease in the LAD with proximal and mid LAD PCI with a Xience Alpine DES stents.He saw Robbie Lis, PA-C on December 17, and was doing very well with no active symptoms.  By that time he had gone back to light activity. He was able to qualify for the Effient discount card which gives him a year's supply of Effient for free. He is also quit smoking with the use of nicotine patches. At that point, he was not started on beta blocker because of bradycardia. He was transitioned to Crestor with a discount card.  Past Medical History  Diagnosis Date  . Depression   . OA (osteoarthritis)     hip, back  . Tobacco abuse   . Diabetes mellitus type II, controlled   . Unstable angina 03/28/2014    EF 60-65% by LV gram.  . CAD S/P percutaneous coronary angioplasty 03/28/2014    PCI to proximal and mid LAD: mLAD - Xience Alpine DES 2.5 mm x 18 mm (3.0 mm), pLAD Xience Alpine DES 2.75 mm 18 mm (3.0 mm distal and 3.6 mm proximal.    Prior Cardiac Evaluation and Past Surgical History: Past Surgical History  Procedure Laterality Date  . Coronary angioplasty with stent placement  03/28/2014  . Inguinal hernia repair Right ~ 2010  . Excisional hemorrhoidectomy  1980's  . Left heart catheterization with coronary angiogram N/A 03/28/2014    Procedure: LEFT HEART CATHETERIZATION WITH CORONARY ANGIOGRAM;  Surgeon: Marykay Lex, MD;  Location: [facility] CATH LAB;  Service: Cardiovascular;  Laterality: N/A;  .  Percutaneous coronary stent intervention (pci-s)  03/28/2014    Procedure: PERCUTANEOUS CORONARY STENT INTERVENTION (PCI-S);  Surgeon: Marykay Lex, MD;  Location: [facility] CATH LAB;  Service: Cardiovascular;;  Mid and Proximal LAD   Interval History: He presents today doing very well without any major complaints. 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 He also walks around mostly golf course. Denies any resting or exertional chest tightness/pressure or dyspnea. No heart failure symptoms of PND, orthopnea or edema. He denies any palpitations or rapid/irregular heartbeats. He at the same time he also denies any fatigue or dizziness that would suggest chronotropic incompetence. No lightheadedness, dizziness, syncope/near syncope or TIA/amaurosis fugax. He has done well with smoking cessation, and has not craved a cigarette since. He is now no longer using the patches either.  ROS: A comprehensive was performed. Review of Systems  Constitutional: Positive for weight loss (Nonintentional, just active). Negative for malaise/fatigue.       He felt bad yesterday just tired and dragging. This is he thinks he may be coming down with a cold or something  HENT: Negative for congestion and nosebleeds.   Respiratory: Negative for cough (significantly improved after stopping cigarettes.), shortness of breath and wheezing.   Cardiovascular: Negative for claudication.  Gastrointestinal: Negative for blood in stool and melena.  Genitourinary: Negative for hematuria.  Musculoskeletal: Negative for myalgias, joint pain and falls.  Endo/Heme/Allergies: Does not bruise/bleed easily.  All other systems reviewed and are negative.  Current Outpatient Prescriptions on File Prior to Visit  Medication Sig Dispense Refill  . aspirin EC 81 MG EC tablet Take 1 tablet (81 mg total) by mouth daily.    Marland Kitchen escitalopram (LEXAPRO) 10 MG tablet Take 1 tablet (10 mg total) by  mouth daily. 90 tablet 3  . glucose monitoring kit (FREESTYLE) monitoring kit 1 each by Does not apply route as needed for other. Dispense any model that is covered- dispense testing supplies for Q AC/ HS accuchecks- 1 month supply with one refil. 1 each 1  . metFORMIN (GLUCOPHAGE) 500 MG tablet TAKE 1 TABLET BY MOUTH TWICE A DAY WITH A MEAL 60 tablet 2  . nitroGLYCERIN (NITROSTAT) 0.4 MG SL tablet Place 1 tablet (0.4 mg total) under the tongue every 5 (five) minutes as needed for chest pain. 25 tablet 2  . prasugrel (EFFIENT) 10 MG TABS tablet Take 1 tablet (10 mg total) by mouth daily. 30 tablet 10  . rosuvastatin (CRESTOR) 20 MG tablet Take 1 tablet (20 mg total) by mouth daily. 90 tablet 3   No current facility-administered medications on file prior to visit.   No Known Allergies  SOCIAL AND FAMILY HISTORY REVIEWED IN EPIC -- NO change  Wt Readings from Last 3 Encounters:  06/27/14 180 lb 8 oz (81.874 kg)  06/02/14 183 lb (83.008 kg)  04/13/14 183 lb 8 oz (83.235 kg)   PHYSICAL EXAM BP 138/70 mmHg  Pulse 55  Ht $R'6\' 1"'BF$  (1.854 m)  Wt 180 lb 8 oz (81.874 kg)  BMI 23.82 kg/m2 General appearance: alert, cooperative, appears stated age, no distress and otherwise healthy-appearing Neck: no adenopathy, soft right carotid bruit and no JVD Lungs: clear to auscultation bilaterally, normal percussion bilaterally and non-labored Heart bradycardic with irregularly irregular rhythm. Frequent ectopy in bigeminy pattern S1& S2 normal, soft systolic ejection murmur at RUSB, click, rub or gallop,  nondisplaced PMI  Abdomen: soft, non-tender; bowel sounds normal; no masses,  no organomegaly;  no HJR  Extremities: extremities normal, atraumatic, no cyanosis, or edema  Pulses: 2+ and symmetric;  Skin: normal and mobility and turgor normal Neurologic: Mental status: Alert, oriented, thought content appropriate Cranial nerves: normal (II-XII grossly intact)   Adult ECG Report  Rate:  55 Rhythm: sinus  bradycardia, premature ventricular contractions (PVC) and In a bigeminy pattern. Anterior T-wave inversions no longer present.  Narrative Interpretation: Abnormal EKG with bigeminy pattern  Recent Labs:      Chemistry      Component Value Date/Time   NA 141 03/29/2014 0441   K 4.4 03/29/2014 0441   CL 105 03/29/2014 0441   CO2 23 03/29/2014 0441   BUN 10 03/29/2014 0441   CREATININE 0.87 03/29/2014 0441   CREATININE 0.85 05/05/2013 1634      Component Value Date/Time   CALCIUM 8.9 03/29/2014 0441   ALKPHOS 50 03/28/2014 0900   AST 29 03/28/2014 0900   ALT 10 03/28/2014 0900   BILITOT 0.3 03/28/2014 0900     Lab Results  Component Value Date   CHOL 161 03/28/2014   HDL 31* 03/28/2014   LDLCALC 103* 03/28/2014   TRIG 135 03/28/2014   CHOLHDL 5.2 03/28/2014    ASSESSMENT / PLAN: CAD S/P 2 site PCI to proximal and mid LAD with a Xience DES stents No active anginal symptoms following PCI.  Unable to tolerate beta blocker because of bradycardia. On DAPT, and statin. If blood pressure were to increase, would use ACE inhibitor or ARB, based on the being a diabetic.   Presence of drug coated stent in LAD coronary artery: Xience Alpine DES 2.75 mm x 18 mm (prox - 3.6 mm), 2.5 mm x 18 mm (~2.9 mm) mid With 2 DES stents in the LAD. I would continue with dual antiplatelet therapy for one year and then would continue with either Effient or Plavix (dated would be better with Effient) indefinitely.  Based on relevant data, could likely suspend Effient use for several days if needed for procedure after 3-6 months. Okay to convert to Plavix if necessary.   Essential hypertension Blood pressure is well-controlled today. He is not currently on any medications. As indicated above, if necessary would use ACE inhibitor.   Unstable angina No recurrent anginal symptoms. EF was normal by LV gram. We will be other reassess this with the echocardiogram is ordered for evaluation of the murmur.   With negative troponins, I don't suspect that there would be any significant wall motion abnormalities..   Former heavy tobacco smoker; quit December 2015 Doing well. Is now beyond the stage of craving. No longer requiring patches.   Cardiac murmur Soft murmur in what appears to be the aortic valve region. Plan: 2-D echocardiogram to obtain baseline on valvular function as well as EF.   Bruit of right carotid artery This could either be radiated murmur or bruit   plan: Carotid Dopplers.   Bradycardia His heart rate is probably actually slower then with EKG shows because of the bigeminy pattern. Not symptomatic. Despite having lots of PVCs, we cannot use beta blocker because the baseline bradycardia. He does not seem to have any signs or symptoms of chronotropic incompetence.   Hyperlipidemia with target LDL less than 70 Not at goal. Converted to Crestor during last visit. Continue at current dose Will be due for lab check in the next month. Lipid panel and LFTs ordered   Encounter for long-term (current) use of medications LFTs and lipid panel for statin therapy. In order to consider safety of using ACE inhibitor, will check chemistry panel as well.   Type 2 diabetes mellitus with complication - CAD Monitored by PCP. He is on metformin.     Orders Placed This Encounter  Procedures  . Lipid panel    Order Specific Question:  Has the patient fasted?    Answer:  Yes  . Comprehensive metabolic panel    Order Specific Question:  Has the patient fasted?    Answer:  Yes  . EKG 12-Lead  . 2D Echocardiogram without contrast    Standing Status: Future     Number of Occurrences: 1     Standing Expiration Date: 06/27/2015    Order Specific Question:  Type of Echo    Answer:  Complete    Order Specific Question:  Where should this test be performed    Answer:  [facility]    Order Specific Question:  Reason for exam-Echo    Answer:  Murmur  785.2 / R01.1  . Doppler  carotid    Dx - right bruit    Standing Status: Future     Number of Occurrences: 1     Standing Expiration Date: 06/27/2015    Order Specific Question:  Laterality    Answer:  Bilateral    Order Specific Question:  Where should this test be performed:  Answer:  [facility]   No orders of the defined types were placed in this encounter.     Followup: 5-6 month  Roc Streett, Leonie Green, M.D., M.S. Interventional Cardiologist   Pager # [phone]

## 2014-06-29 NOTE — Assessment & Plan Note (Signed)
His heart rate is probably actually slower then with EKG shows because of the bigeminy pattern. Not symptomatic. Despite having lots of PVCs, we cannot use beta blocker because the baseline bradycardia. He does not seem to have any signs or symptoms of chronotropic incompetence.

## 2014-06-29 NOTE — Assessment & Plan Note (Signed)
No active anginal symptoms following PCI. Unable to tolerate beta blocker because of bradycardia. On DAPT, and statin. If blood pressure were to increase, would use ACE inhibitor or ARB, based on the being a diabetic.

## 2014-06-30 ENCOUNTER — Telehealth: Payer: Self-pay | Admitting: *Deleted

## 2014-06-30 NOTE — Telephone Encounter (Signed)
-----   Message from Marykay Lexavid W Harding, MD sent at 06/29/2014  9:24 PM EST ----- Echo results: Good news: Essentially normal echocardiogram and normal pump function and normal valve function.  No regional wall motion abnormalities = No signs to suggest heart attack.. EF: 60-65 %.   Marykay LexHARDING, DAVID W, MD

## 2014-06-30 NOTE — Telephone Encounter (Signed)
LEFT MESSAGE ON VOICEMAIL-  RELEASE TO MY CHART.

## 2014-07-06 ENCOUNTER — Telehealth: Payer: Self-pay | Admitting: *Deleted

## 2014-07-06 NOTE — Telephone Encounter (Signed)
Left message to call back concerning test results carotid, echo

## 2014-07-06 NOTE — Telephone Encounter (Signed)
-----   Message from Marykay Lexavid W Harding, MD sent at 07/03/2014 10:16 PM EST ----- OK - Arvil Chacohnks ----- Message -----    From: Runell GessJonathan J Berry, MD    Sent: 07/03/2014   5:21 PM      To: Marykay Lexavid W Harding, MD  Just continue to follow the left side ----- Message -----    From: Marykay Lexavid W Harding, MD    Sent: 07/03/2014   7:34 AM      To: Tobin ChadSharon V. Janace Decker, RN, Runell GessJonathan J Berry, MD  So the study shows Right Carotid (common & internal) occlusion with minimal disease in the Left.  Will forward to Dr. Allyson SabalBerry for Recommendations - but usually, we would just watch the Left side.  DH

## 2014-07-06 NOTE — Telephone Encounter (Signed)
-----   Message from Marykay Lexavid W Harding, MD sent at 06/29/2014  9:24 PM EST ----- Echo results: Good news: Essentially normal echocardiogram and normal pump function and normal valve function.  No regional wall motion abnormalities = No signs to suggest heart attack.. EF: 60-65 %.   Marykay LexHARDING, DAVID W, MD

## 2014-07-11 ENCOUNTER — Other Ambulatory Visit (INDEPENDENT_AMBULATORY_CARE_PROVIDER_SITE_OTHER): Payer: 59

## 2014-07-11 DIAGNOSIS — E119 Type 2 diabetes mellitus without complications: Secondary | ICD-10-CM

## 2014-07-11 LAB — MICROALBUMIN / CREATININE URINE RATIO
CREATININE, U: 421.3 mg/dL
MICROALB UR: 6.7 mg/dL — AB (ref 0.0–1.9)
Microalb Creat Ratio: 1.6 mg/g (ref 0.0–30.0)

## 2014-07-11 LAB — HEMOGLOBIN A1C: HEMOGLOBIN A1C: 7.2 % — AB (ref 4.6–6.5)

## 2014-07-12 LAB — COMPREHENSIVE METABOLIC PANEL
ALT: 11 U/L (ref 0–53)
AST: 20 U/L (ref 0–37)
Albumin: 4.2 g/dL (ref 3.5–5.2)
Alkaline Phosphatase: 52 U/L (ref 39–117)
BUN: 19 mg/dL (ref 6–23)
CALCIUM: 9.2 mg/dL (ref 8.4–10.5)
CHLORIDE: 100 meq/L (ref 96–112)
CO2: 25 mEq/L (ref 19–32)
Creat: 1.02 mg/dL (ref 0.50–1.35)
Glucose, Bld: 108 mg/dL — ABNORMAL HIGH (ref 70–99)
Potassium: 4.3 mEq/L (ref 3.5–5.3)
Sodium: 138 mEq/L (ref 135–145)
TOTAL PROTEIN: 6.5 g/dL (ref 6.0–8.3)
Total Bilirubin: 0.7 mg/dL (ref 0.2–1.2)

## 2014-07-12 LAB — LIPID PANEL
CHOL/HDL RATIO: 2.4 ratio
Cholesterol: 98 mg/dL (ref 0–200)
HDL: 41 mg/dL (ref >=40)
LDL Cholesterol: 41 mg/dL (ref 0–99)
TRIGLYCERIDES: 78 mg/dL (ref <150)
VLDL: 16 mg/dL (ref 0–40)

## 2014-07-13 ENCOUNTER — Other Ambulatory Visit: Payer: Self-pay | Admitting: Family Medicine

## 2014-07-13 DIAGNOSIS — E118 Type 2 diabetes mellitus with unspecified complications: Secondary | ICD-10-CM

## 2014-07-14 ENCOUNTER — Encounter: Payer: Self-pay | Admitting: *Deleted

## 2014-07-20 ENCOUNTER — Other Ambulatory Visit: Payer: Self-pay | Admitting: Cardiology

## 2014-07-20 NOTE — Telephone Encounter (Signed)
His PCP will need to follow his DM and make this determination.

## 2014-07-20 NOTE — Telephone Encounter (Signed)
Metformin refill refused - defer to PCP 

## 2014-07-26 ENCOUNTER — Other Ambulatory Visit: Payer: Self-pay

## 2014-07-26 NOTE — Telephone Encounter (Signed)
Pt left v/m requesting refill metformin to [facility]; pt is out of med.pt request cb. Dr Para Marchuncan has not filled metformin before.Please advise.

## 2014-07-27 MED ORDER — METFORMIN HCL 500 MG PO TABS: ORAL_TABLET | ORAL | Status: DC

## 2014-07-27 NOTE — Telephone Encounter (Signed)
Patient states labs are scheduled in June.

## 2014-07-27 NOTE — Telephone Encounter (Signed)
Sent.  Due for f/u labs now.  Orders are in.

## 2014-10-10 ENCOUNTER — Other Ambulatory Visit (INDEPENDENT_AMBULATORY_CARE_PROVIDER_SITE_OTHER): Payer: 59

## 2014-10-10 DIAGNOSIS — E118 Type 2 diabetes mellitus with unspecified complications: Secondary | ICD-10-CM | POA: Diagnosis not present

## 2014-10-10 DIAGNOSIS — E119 Type 2 diabetes mellitus without complications: Secondary | ICD-10-CM | POA: Diagnosis not present

## 2014-10-10 LAB — HEMOGLOBIN A1C: Hgb A1c MFr Bld: 7.3 % — ABNORMAL HIGH (ref 4.6–6.5)

## 2014-10-10 LAB — LIPID PANEL
Cholesterol: 98 mg/dL (ref 0–200)
HDL: 32.2 mg/dL — AB (ref >39.00)
LDL CALC: 46 mg/dL (ref 0–99)
NONHDL: 65.8
Total CHOL/HDL Ratio: 3
Triglycerides: 100 mg/dL (ref 0.0–149.0)
VLDL: 20 mg/dL (ref 0.0–40.0)

## 2014-10-10 LAB — MICROALBUMIN / CREATININE URINE RATIO
Creatinine,U: 231.4 mg/dL
MICROALB/CREAT RATIO: 2 mg/g (ref 0.0–30.0)
Microalb, Ur: 4.7 mg/dL — ABNORMAL HIGH (ref 0.0–1.9)

## 2014-10-13 ENCOUNTER — Encounter: Payer: Self-pay | Admitting: Family Medicine

## 2014-10-13 ENCOUNTER — Ambulatory Visit: Payer: 59 | Admitting: Family Medicine

## 2014-10-13 ENCOUNTER — Ambulatory Visit (INDEPENDENT_AMBULATORY_CARE_PROVIDER_SITE_OTHER): Payer: 59 | Admitting: Family Medicine

## 2014-10-13 VITALS — BP 110/60 | HR 56 | Temp 98.3°F | Wt 180.5 lb

## 2014-10-13 DIAGNOSIS — L723 Sebaceous cyst: Secondary | ICD-10-CM

## 2014-10-13 DIAGNOSIS — E118 Type 2 diabetes mellitus with unspecified complications: Secondary | ICD-10-CM

## 2014-10-13 MED ORDER — LISINOPRIL 2.5 MG PO TABS: 2.5000 mg | ORAL_TABLET | Freq: Every day | ORAL | Status: DC

## 2014-10-13 NOTE — Patient Instructions (Addendum)
Drink less coke, start lisinopril.   Recheck labs in about 3 months before a visit.  Remove packing in 48 hours, keep area clean and bandaged in the meantime, follow up if concerns.   Wash with soap and water after the packing is off.   Take care. Glad to see you.

## 2014-10-13 NOTE — Progress Notes (Signed)
Pre visit review using our clinic review tool, if applicable. No additional management support is needed unless otherwise documented below in the visit note.  Diabetes:  Using medications without difficulties:yes Hypoglycemic episodes:no sx Hyperglycemic episodes: no sx Feet problems:no Blood Sugars averaging: not checked.   A1c stable, still >7.  Lipids okay, MALB up.  D/w pt.  Not yet on ACE, will start today.  D/w pt about rationale.   I&D  Meds, vitals, and allergies reviewed.   Indication: irritated seb cyst  Pt complaints of: erythema, pain, swelling  Location: L upper back  Size: ~2cm  Informed consent obtained.  Pt aware of risks not limited to but including infection, bleeding, damage to near by organs.  Prep: etoh/betadine  Anesthesia: 1%lidocaine with epi, good effect  Incision made with #11 blade  Would explored and loculations removed  Wound packed with iodoform gauze, no complications.  Tolerated well.   nad ncat rrr ctab

## 2014-10-15 DIAGNOSIS — L723 Sebaceous cyst: Secondary | ICD-10-CM | POA: Insufficient documentation

## 2014-10-15 NOTE — Assessment & Plan Note (Signed)
Continue diet but no change in metformin.  Add on ACE given MALB.  D/w pt.  He agrees. Recheck in ~3 months.  Needs work on diet.

## 2014-10-15 NOTE — Assessment & Plan Note (Signed)
I&D tolerated well.  Routine postprocedure instructions d/w pt- remove packing in 48h, keep area clean and bandaged, follow up if concerns/spreading erythema/pain.  F/u prn.  He agrees.

## 2014-10-23 ENCOUNTER — Other Ambulatory Visit: Payer: Self-pay

## 2015-01-07 ENCOUNTER — Other Ambulatory Visit: Payer: Self-pay | Admitting: Family Medicine

## 2015-01-07 DIAGNOSIS — E118 Type 2 diabetes mellitus with unspecified complications: Secondary | ICD-10-CM

## 2015-01-11 ENCOUNTER — Other Ambulatory Visit (INDEPENDENT_AMBULATORY_CARE_PROVIDER_SITE_OTHER): Payer: 59

## 2015-01-11 DIAGNOSIS — E118 Type 2 diabetes mellitus with unspecified complications: Secondary | ICD-10-CM | POA: Diagnosis not present

## 2015-01-11 LAB — HEMOGLOBIN A1C: HEMOGLOBIN A1C: 7.3 % — AB (ref 4.6–6.5)

## 2015-01-15 ENCOUNTER — Ambulatory Visit: Payer: 59 | Admitting: Family Medicine

## 2015-01-15 ENCOUNTER — Telehealth: Payer: Self-pay | Admitting: Family Medicine

## 2015-01-15 NOTE — Telephone Encounter (Signed)
Pt did not come in for their appt today for follow up. Please let me know if pt needs to be contacted immediately for follow up or no follow up needed. Best phone number to contact pt is [phone].

## 2015-01-15 NOTE — Telephone Encounter (Signed)
Yes f/u when possible.  Thanks.

## 2015-01-18 ENCOUNTER — Other Ambulatory Visit: Payer: Self-pay | Admitting: *Deleted

## 2015-01-18 NOTE — Telephone Encounter (Signed)
Received refill request from [facility] for escitaloptam  QD, with a note that pt " is now taking 20 mg daily". They request a new rx sent in at that dose. I didn't see any record of med being increased by Dr Para March. I called and left a generic message on pt's cell for him to return call to office. When we reach him, need to find out who increased med and when.

## 2015-01-18 NOTE — Telephone Encounter (Signed)
Pt left v/m requesting cb. 

## 2015-01-18 NOTE — Telephone Encounter (Signed)
Patient states that he increased his Escitaloptam to  himself. He states that he is having a rough time, and decided to up himself to taking  daily. Please advise.

## 2015-01-18 NOTE — Telephone Encounter (Signed)
Left a message for pt to call back and reschedule appt.

## 2015-01-19 MED ORDER — ESCITALOPRAM OXALATE 20 MG PO TABS: 20.0000 mg | ORAL_TABLET | Freq: Every day | ORAL | Status: DC

## 2015-01-19 NOTE — Telephone Encounter (Signed)
Message left for patient to return my call.  

## 2015-01-19 NOTE — Telephone Encounter (Signed)
Sent.  Please set f/u for next week.  Thanks.  Don't go above  a day.

## 2015-01-23 NOTE — Telephone Encounter (Signed)
Patient notified. Appt scheduled.

## 2015-01-26 ENCOUNTER — Encounter: Payer: Self-pay | Admitting: Family Medicine

## 2015-01-26 ENCOUNTER — Ambulatory Visit (INDEPENDENT_AMBULATORY_CARE_PROVIDER_SITE_OTHER): Payer: 59 | Admitting: Family Medicine

## 2015-01-26 VITALS — BP 108/62 | HR 44 | Temp 97.7°F | Wt 178.2 lb

## 2015-01-26 DIAGNOSIS — E119 Type 2 diabetes mellitus without complications: Secondary | ICD-10-CM | POA: Diagnosis not present

## 2015-01-26 DIAGNOSIS — E118 Type 2 diabetes mellitus with unspecified complications: Secondary | ICD-10-CM | POA: Diagnosis not present

## 2015-01-26 DIAGNOSIS — Z23 Encounter for immunization: Secondary | ICD-10-CM | POA: Diagnosis not present

## 2015-01-26 DIAGNOSIS — F329 Major depressive disorder, single episode, unspecified: Secondary | ICD-10-CM

## 2015-01-26 DIAGNOSIS — F32A Depression, unspecified: Secondary | ICD-10-CM

## 2015-01-26 DIAGNOSIS — Z119 Encounter for screening for infectious and parasitic diseases, unspecified: Secondary | ICD-10-CM | POA: Diagnosis not present

## 2015-01-26 NOTE — Progress Notes (Signed)
Pre visit review using our clinic review tool, if applicable. No additional management support is needed unless otherwise documented below in the visit note.  He had upped his lexapro.  He had some external stressors (which have gotten some better in the meantime) and the med changed helped.  No SI/HI.    Diabetes:  Using medications without difficulties:yes Hypoglycemic episodes: not checked. No sx Hyperglycemic episodes: not checked.  No sx Feet problems: no Blood Sugars averaging:  Not checked eye exam within last year: encouraged A1c similar to prev.  D/w pt.    Pt opts in for HCV and HIV screening with next set of labs.  D/w pt re: routine screening.     Meds, vitals, and allergies reviewed.   ROS: See HPI.  Otherwise negative.    GEN: nad, alert and oriented HEENT: mucous membranes moist NECK: supple w/o LA CV: rrr. PULM: ctab, no inc wob ABD: soft, +bs EXT: no edema SKIN: no acute rash  Diabetic foot exam: Normal inspection No skin breakdown No calluses  Normal DP pulses Normal sensation to light touch and monofilament Nails normal except for L 1st nail cracked- is growing out normally.

## 2015-01-26 NOTE — Patient Instructions (Addendum)
Check and see if you have had a shingles shot.  If not, then check with your insurance to see if they will cover the shingles shot. Call the cardiac clinic and ask about follow up.  Recheck in the end of January, labs ahead of time.  Take care.  Glad to see you.

## 2015-01-28 NOTE — Assessment & Plan Note (Signed)
Improved on current dose of SSRI.  He'll update me as needed.

## 2015-01-28 NOTE — Assessment & Plan Note (Signed)
Continue metformin as is.   Recheck in the end of January, labs ahead of time.  With work on diet, he may be able to get A1c lower. Okay for outpatient f/u.  He agrees with plan.

## 2015-02-08 ENCOUNTER — Other Ambulatory Visit: Payer: Self-pay | Admitting: Family Medicine

## 2015-02-08 MED ORDER — METFORMIN HCL 500 MG PO TABS: ORAL_TABLET | ORAL | Status: DC

## 2015-02-08 NOTE — Telephone Encounter (Signed)
Received fax refill request for   metformin (GLUCOPHAGE) 500 MG tablet Dispense   60 tablet Refill   5 07/27/2014   TAKE 1 TABLET BY MOUTH TWICE A DAY WITH A MEAL  Last prescribed on 07/27/2014. Last seen on 01/26/2015.  Will refill according to Dr Lianne Bushyuncan's note on visit on 01/26/2015.

## 2015-03-01 ENCOUNTER — Other Ambulatory Visit: Payer: Self-pay | Admitting: Cardiology

## 2015-03-01 MED ORDER — PRASUGREL HCL 10 MG PO TABS: 10.0000 mg | ORAL_TABLET | Freq: Every day | ORAL | Status: DC

## 2015-03-09 ENCOUNTER — Ambulatory Visit (INDEPENDENT_AMBULATORY_CARE_PROVIDER_SITE_OTHER): Payer: 59 | Admitting: Family Medicine

## 2015-03-09 ENCOUNTER — Encounter: Payer: Self-pay | Admitting: Family Medicine

## 2015-03-09 VITALS — BP 114/60 | HR 64 | Temp 98.0°F | Ht 73.0 in | Wt 180.5 lb

## 2015-03-09 DIAGNOSIS — M545 Low back pain, unspecified: Secondary | ICD-10-CM | POA: Insufficient documentation

## 2015-03-09 DIAGNOSIS — J069 Acute upper respiratory infection, unspecified: Secondary | ICD-10-CM | POA: Diagnosis not present

## 2015-03-09 DIAGNOSIS — B9789 Other viral agents as the cause of diseases classified elsewhere: Principal | ICD-10-CM

## 2015-03-09 MED ORDER — DICLOFENAC SODIUM 75 MG PO TBEC: 75.0000 mg | DELAYED_RELEASE_TABLET | Freq: Two times a day (BID) | ORAL | Status: DC

## 2015-03-09 NOTE — Assessment & Plan Note (Signed)
No radiculopathy.  Treat with heat , gentle stretching and start NSAID. Continue muscle relaxant at night.

## 2015-03-09 NOTE — Progress Notes (Signed)
Pre visit review using our clinic review tool, if applicable. No additional management support is needed unless otherwise documented below in the visit note. 

## 2015-03-09 NOTE — Assessment & Plan Note (Signed)
Symptomatic care 

## 2015-03-09 NOTE — Progress Notes (Signed)
Subjective:    Patient ID: Tony Lynch, male    DOB: 05/17/1950, [age] y.o.   MRN: [id number]  Sinusitis This is a new problem. The current episode started in the past 7 days (2-3 days). The problem has been gradually worsening since onset. There has been no fever. Associated symptoms include congestion, coughing, sneezing and a sore throat. Pertinent negatives include no ear pain or sinus pressure. (Fatigued  body ache  runny nose, congestion) The treatment provided no relief.  Cough This is a new problem. The current episode started in the past 7 days (2-3 days). The cough is productive of sputum. Associated symptoms include a sore throat. Pertinent negatives include no ear pain or fever. Risk factors for lung disease include smoking/tobacco exposure. He has tried nothing for the symptoms. The treatment provided no relief. His past medical history is significant for COPD.  Back Pain This is a new problem. The current episode started yesterday. The problem occurs constantly. The problem is unchanged. The pain is present in the lumbar spine (right). The pain does not radiate. The pain is at a severity of 9/10. The pain is moderate. The pain is worse during the day. Exacerbated by: hurts no matter what. Stiffness is present in the morning, at night and all day. Associated symptoms include tingling. Pertinent negatives include no bladder incontinence, bowel incontinence, fever, leg pain, numbness or weakness. Risk factors include lack of exercise. He has tried muscle relaxant (back brace) for the symptoms. The treatment provided no relief.   Social History /Family History/Past Medical History reviewed and updated if needed. CAD, DM, HTN  Former smoker: 30-60 pack year history  Has history of back issues: ruptured disc years ago. No surgeries on back.   Review of Systems  Constitutional: Negative for fever.  HENT: Positive for congestion, sneezing and sore throat. Negative for ear pain and sinus  pressure.   Respiratory: Positive for cough.   Gastrointestinal: Negative for bowel incontinence.  Genitourinary: Negative for bladder incontinence.  Musculoskeletal: Positive for back pain.  Neurological: Positive for tingling. Negative for weakness and numbness.       Objective:   Physical Exam  Constitutional: Vital signs are normal. He appears well-developed and well-nourished.  HENT:  Head: Normocephalic.  Right Ear: Hearing normal. No tenderness. Tympanic membrane is not erythematous and not bulging. A middle ear effusion is present.  Left Ear: Hearing normal. No tenderness. Tympanic membrane is not erythematous and not bulging. A middle ear effusion is present.  Nose: Mucosal edema and rhinorrhea present.  Mouth/Throat: Oropharynx is clear and moist and mucous membranes are normal. No posterior oropharyngeal erythema.  Neck: Trachea normal. Carotid bruit is not present. No thyroid mass and no thyromegaly present.  Cardiovascular: Normal rate, regular rhythm and normal pulses.  Exam reveals no gallop, no distant heart sounds and no friction rub.   No murmur heard. No peripheral edema  Pulmonary/Chest: Effort normal and breath sounds normal. No respiratory distress.  Musculoskeletal:       Lumbar back: He exhibits tenderness. He exhibits normal range of motion and no bony tenderness.  Neg SLR, neg faber's  Neurological: He has normal strength. No cranial nerve deficit or sensory deficit. He displays a negative Romberg sign. Gait normal.  Skin: Skin is warm, dry and intact. No rash noted.  Psychiatric: He has a normal mood and affect. His speech is normal and behavior is normal. Thought content normal.          Assessment &  Plan:

## 2015-03-09 NOTE — Patient Instructions (Addendum)
Start  diclofenac twice daily for pain and inflammation.  Heat, gentle stretching. Can use muscle relaxant at night.  Start Mucinex DM twice daily. Start nasal saline spray in nose 2-3 times daily.  Call if not improving as expected.

## 2015-04-10 ENCOUNTER — Encounter: Payer: Self-pay | Admitting: Internal Medicine

## 2015-04-10 ENCOUNTER — Ambulatory Visit (INDEPENDENT_AMBULATORY_CARE_PROVIDER_SITE_OTHER): Payer: 59 | Admitting: Internal Medicine

## 2015-04-10 VITALS — BP 124/76 | HR 82 | Temp 98.2°F | Wt 176.0 lb

## 2015-04-10 DIAGNOSIS — R059 Cough, unspecified: Secondary | ICD-10-CM

## 2015-04-10 DIAGNOSIS — R0982 Postnasal drip: Secondary | ICD-10-CM | POA: Diagnosis not present

## 2015-04-10 DIAGNOSIS — R05 Cough: Secondary | ICD-10-CM

## 2015-04-10 NOTE — Progress Notes (Signed)
Pre visit review using our clinic review tool, if applicable. No additional management support is needed unless otherwise documented below in the visit note. 

## 2015-04-10 NOTE — Patient Instructions (Signed)

## 2015-04-10 NOTE — Progress Notes (Signed)
HPI  Tony Lynch presents to the clinic today with c/o cough. This started 1 month ago. The cough is productive of yellow mucous, usually in the morning. He denies runny nose, nasal congestion or sore throat. He denies fever but has had chills. He has taken Mucinex DM with some relief. He has no history of allergies or breathing problems. He has not had sick contacts that he is aware of.  Review of Systems      Past Medical History  Diagnosis Date  . Depression   . OA (osteoarthritis)     hip, back  . Tobacco abuse   . Diabetes mellitus type II, controlled (HCC)   . Unstable angina (HCC) 03/28/2014    EF 60-65% by LV gram.  . CAD S/P percutaneous coronary angioplasty 03/28/2014    PCI to proximal and mid LAD: mLAD - Xience Alpine DES 2.5 mm x 18 mm (3.0 mm), pLAD Xience Alpine DES 2.75 mm 18 mm (3.0 mm distal and 3.6 mm proximal.    Family History  Problem Relation Age of Onset  . Diabetes Sister   . Heart disease Sister     died of MI  . Stroke Sister     2 strokes   . Heart disease Brother     multiple cardiac surgeries  . Heart disease Sister     stent  . Cancer Sister     breast cancer  . Heart disease Mother   . Prostate cancer Neg Hx   . Colon cancer Neg Hx   . Heart disease Sister     Social History   Social History  . Marital Status: Divorced    Spouse Name: N/A  . Number of Children: N/A  . Years of Education: N/A   Occupational History  . Not on file.   Social History Main Topics  . Smoking status: Former Smoker -- 1.50 packs/day for 46 years    Types: Cigarettes  . Smokeless tobacco: Never Used  . Alcohol Use: No  . Drug Use: No  . Sexual Activity: Not on file   Other Topics Concern  . Not on file   Social History Narrative   Smoker 1.5 ppd x 50 years    Lives alone    1 son and 1 daughter, 2 grandsons, divorced    Works at Systems analyst course full time    5/6 siblings alive    [employer] fan, Archivist    No Known Allergies   Constitutional: Denies  headache, fatigue, fever or abrupt weight changes.  HEENT:  Denies eye redness, eye pain, pressure behind the eyes, facial pain, nasal congestion, ear pain, ringing in the ears, wax buildup, runny nose or sore throat. Respiratory: Positive cough. Denies difficulty breathing or shortness of breath.  Cardiovascular: Denies chest pain, chest tightness, palpitations or swelling in the hands or feet.   No other specific complaints in a complete review of systems (except as listed in HPI above).  Objective:   BP 124/76 mmHg  Pulse 82  Temp(Src) 98.2 F (36.8 C) (Oral)  Wt 176 lb (79.833 kg)  SpO2 97% Wt Readings from Last 3 Encounters:  04/10/15 176 lb (79.833 kg)  03/09/15 180 lb 8 oz (81.874 kg)  01/26/15 178 lb 4 oz (80.854 kg)     General: Appears his stated age,  in NAD. HEENT: Head: normal shape and size, no sinus tenderness noted; Eyes: sclera white, no icterus, conjunctiva pink; Ears: Tm's gray and intact, normal light reflex; Nose: mucosa  pink and moist, septum midline; Throat/Mouth: + PND. Teeth present, mucosa pink and moist, no exudate noted, no lesions or ulcerations noted.  Neck: No cervical lymphadenopathy.  Cardiovascular: Normal rate and rhythm. S1,S2 noted.  No murmur, rubs or gallops noted.  Pulmonary/Chest: Normal effort and positive vesicular breath sounds. No respiratory distress. No wheezes, rales or ronchi noted.      Assessment & Plan:   Cough secondary to post nasal drip:  Get some rest and drink plenty of water Start Flonase and Allegra daily x 2 weeks Delsym as needed for cough  RTC as needed or if symptoms persist.

## 2015-05-18 ENCOUNTER — Other Ambulatory Visit: Payer: Self-pay | Admitting: Cardiology

## 2015-06-19 ENCOUNTER — Telehealth: Payer: Self-pay | Admitting: Cardiology

## 2015-06-19 MED ORDER — ROSUVASTATIN CALCIUM 20 MG PO TABS: 20.0000 mg | ORAL_TABLET | Freq: Every day | ORAL | Status: DC

## 2015-06-19 NOTE — Telephone Encounter (Signed)
Rx for generic sent to pharmacy

## 2015-06-19 NOTE — Telephone Encounter (Signed)
New message     Pt c/o medication issue:  1. Name of Medication: crestor 2. How are you currently taking this medication (dosage and times per day)?  3. Are you having a reaction (difficulty breathing--STAT)? no 4. What is your medication issue? Doctor wrote presc for brand name.  It will cost over 200.00 with insurance.  Can he have the generic?

## 2015-07-16 ENCOUNTER — Other Ambulatory Visit: Payer: Self-pay | Admitting: Cardiology

## 2015-07-17 ENCOUNTER — Other Ambulatory Visit: Payer: Self-pay | Admitting: *Deleted

## 2015-07-17 MED ORDER — ESCITALOPRAM OXALATE 20 MG PO TABS: 20.0000 mg | ORAL_TABLET | Freq: Every day | ORAL | Status: DC

## 2015-07-19 ENCOUNTER — Telehealth: Payer: Self-pay | Admitting: *Deleted

## 2015-07-19 MED ORDER — PRASUGREL HCL 10 MG PO TABS: 10.0000 mg | ORAL_TABLET | Freq: Every day | ORAL | Status: DC

## 2015-07-19 NOTE — Telephone Encounter (Signed)
E-sent to [facility]. Patient aware.

## 2015-07-19 NOTE — Telephone Encounter (Signed)
-----   Message from Sampson GoonShawnee I Trigloff sent at 07/18/2015  9:04 AM EDT ----- Regarding: Effient meds   Sharon/Cheryl  This patient is scheduled to see Dr. Herbie BaltimoreHarding on 08-09-15, but, he will be out of his Effient, can you send a refill in for him?  [facility] [place] fax [phone].  Thanks ONEOKShawnee

## 2015-08-09 ENCOUNTER — Ambulatory Visit: Payer: Self-pay | Admitting: Cardiology

## 2015-08-09 ENCOUNTER — Ambulatory Visit (INDEPENDENT_AMBULATORY_CARE_PROVIDER_SITE_OTHER): Payer: BLUE CROSS/BLUE SHIELD | Admitting: Cardiology

## 2015-08-09 VITALS — BP 118/68 | HR 67 | Ht 73.0 in | Wt 178.4 lb

## 2015-08-09 DIAGNOSIS — Z87891 Personal history of nicotine dependence: Secondary | ICD-10-CM

## 2015-08-09 DIAGNOSIS — Z955 Presence of coronary angioplasty implant and graft: Secondary | ICD-10-CM | POA: Diagnosis not present

## 2015-08-09 DIAGNOSIS — I2510 Atherosclerotic heart disease of native coronary artery without angina pectoris: Secondary | ICD-10-CM

## 2015-08-09 DIAGNOSIS — R011 Cardiac murmur, unspecified: Secondary | ICD-10-CM

## 2015-08-09 DIAGNOSIS — Z9861 Coronary angioplasty status: Secondary | ICD-10-CM

## 2015-08-09 DIAGNOSIS — I10 Essential (primary) hypertension: Secondary | ICD-10-CM

## 2015-08-09 DIAGNOSIS — E118 Type 2 diabetes mellitus with unspecified complications: Secondary | ICD-10-CM

## 2015-08-09 DIAGNOSIS — I200 Unstable angina: Secondary | ICD-10-CM

## 2015-08-09 DIAGNOSIS — E785 Hyperlipidemia, unspecified: Secondary | ICD-10-CM

## 2015-08-09 NOTE — Patient Instructions (Signed)
NO CHANGE WITH CURRENT MEDICATIONS  YOU MAY HOLD EFFIENT  IF NEEDED FOR PROCEDURES OR BRUISING - CALL OFFICE  Your physician wants you to follow-up in 12 MONTHS WITH DR HARDING. You will receive a reminder letter in the mail two months in advance. If you don't receive a letter, please call our office to schedule the follow-up appointment.  If you need a refill on your cardiac medications before your next appointment, please call your pharmacy.

## 2015-08-09 NOTE — Progress Notes (Signed)
PCP: Elsie Stain, MD  Clinic Note: Chief Complaint  Patient presents with  . Follow-up    CHEST PAIN;tighting after taking [facility] Power   . Coronary Artery Disease    - h/o Unstable angina - CAD- PCI LAD x 2 lesions    HPI: Tony Lynch is a 65 y.o. male with a PMH below who presents today for ~ annual f/u - s/p ACS-PCI. Had Unstable Angina in Dec 2015 -- PCI to LAD with Xience Alpine DES.   JAMARIOUS FEBO was last seen in March 2016 - was doing well.  Back to being active & playing golf.   Recent Hospitalizations: n/a  Studies Reviewed:   Echo 06/2014: E 60-65%. NO RWMA.  Normal Valve Fxn. BiAtrial Enlarement.  He has done well with smoking cessation, and has not craved a cigarette since. He is now no longer using the patches either. Trying to eat better - more conscious of what he eats.  Interval History: Still doing well, as active as necessary.  Had some epigastric/chest discomfort after a BC powder last week, but that is the only chest symptom since the MI.  He did not describe this as being anything similar to his MI pain just goes in his chest. He has not had any heart failure symptoms of PND, orthopnea or edema. No chest tightness or pressure with rest or exertion. No palpitations, lightheadedness, dizziness, weakness or syncope/near syncope. No TIA/amaurosis fugax symptoms. No claudication.  ROS: A comprehensive was performed. Review of Systems  Constitutional: Positive for weight loss. Negative for malaise/fatigue.  HENT: Negative for congestion and nosebleeds.   Eyes: Negative for blurred vision and double vision.  Respiratory: Negative for cough, shortness of breath and wheezing.   Cardiovascular: Negative.        Per HPI  Gastrointestinal: Negative for constipation, blood in stool and melena.  Genitourinary: Negative for hematuria.  Musculoskeletal: Negative for myalgias, joint pain and falls.  Neurological: Negative for dizziness, speech change, focal  weakness, seizures, loss of consciousness and headaches.  Endo/Heme/Allergies: Bruises/bleeds easily.  Psychiatric/Behavioral: Negative for depression and memory loss. The patient is not nervous/anxious and does not have insomnia.   All other systems reviewed and are negative.   Past Medical History  Diagnosis Date  . Depression   . OA (osteoarthritis)     hip, back  . Tobacco abuse   . Diabetes mellitus type II, controlled ([place])   . Unstable angina ([place]) 03/28/2014    EF 60-65% by LV gram.  . CAD S/P percutaneous coronary angioplasty 03/28/2014    PCI to proximal and mid LAD: mLAD - Xience Alpine DES 2.5 mm x 18 mm (3.0 mm), pLAD Xience Alpine DES 2.75 mm 18 mm (3.0 mm distal and 3.6 mm proximal.    Past Surgical History  Procedure Laterality Date  . Inguinal hernia repair Right ~ 2010  . Excisional hemorrhoidectomy  1980's  . Left heart catheterization with coronary angiogram N/A 03/28/2014    Procedure: LEFT HEART CATHETERIZATION WITH CORONARY ANGIOGRAM;  Surgeon: Leonie Man, MD;  Location: [facility] CATH LAB;  Service: CV: pLAD 95% (after SP1 & D1), mLAD 75%, Cx becomes bifurcating OM -> inferior branch 60%, downward takeoff RCA with mid 40-50%.  . Percutaneous coronary stent intervention (pci-s)  03/28/2014    Procedure: PERCUTANEOUS CORONARY STENT INTERVENTION (PCI-S);  Surgeon: Leonie Man, MD;  Location: [facility] CATH LAB;  Service: Cardiovascular;;  mLAD - Xience DES 2.5 mm x 18 mm (3.0 mm), pLAD  Xience 2.75 mm x 18 mm (3.0 - 3.6 mm)   . Transthoracic echocardiogram  06/2014    EF 60-65% - no RWMA.  Gr 1 DD. Mild LA & RA dilation    Prior to Admission medications   Medication Sig Start Date End Date Taking? Authorizing Provider  diclofenac (VOLTAREN) 75 MG EC tablet Take 1 tablet (75 mg total) by mouth 2 (two) times daily. 03/09/15   Amy Cletis Athens, MD  escitalopram (LEXAPRO) 20 MG tablet Take 1 tablet (20 mg total) by mouth daily. 07/17/15   Tonia Ghent, MD  glucose monitoring  kit (FREESTYLE) monitoring kit 1 each by Does not apply route as needed for other. Dispense any model that is covered- dispense testing supplies for Q AC/ HS accuchecks- 1 month supply with one refil. 06/16/13   Lorayne Marek, MD  lisinopril (ZESTRIL) 2.5 MG tablet Take 1 tablet (2.5 mg total) by mouth daily. 10/13/14   Tonia Ghent, MD  metFORMIN (GLUCOPHAGE) 500 MG tablet TAKE 1 TABLET BY MOUTH TWICE A DAY WITH A MEAL 02/08/15   Tonia Ghent, MD  nitroGLYCERIN (NITROSTAT) 0.4 MG SL tablet Place 1 tablet (0.4 mg total) under the tongue every 5 (five) minutes as needed for chest pain. 03/29/14   Brittainy Erie Noe, PA-C  prasugrel (EFFIENT) 10 MG TABS tablet Take 1 tablet (10 mg total) by mouth daily. 07/19/15   Leonie Man, MD  rosuvastatin (CRESTOR) 20 MG tablet Take 1 tablet (20 mg total) by mouth daily. 06/19/15   Leonie Man, MD    No Known Allergies   Social History   Social History  . Marital Status: Divorced    Spouse Name: N/A  . Number of Children: N/A  . Years of Education: N/A   Social History Main Topics  . Smoking status: Former Smoker -- 1.50 packs/day for 46 years    Types: Cigarettes  . Smokeless tobacco: Never Used  . Alcohol Use: No  . Drug Use: No  . Sexual Activity: Not Asked   Other Topics Concern  . None   Social History Narrative   Smoker 1.5 ppd x 50 years    Lives alone    1 son and 1 daughter, 2 grandsons, divorced    Works at Office manager course full time    5/6 siblings alive    [employer] fan, Actuary   Family History  family history includes Cancer in his sister; Diabetes in his sister; Heart disease in his brother, mother, sister, sister, and sister; Stroke in his sister. There is no history of Prostate cancer or Colon cancer.  Wt Readings from Last 3 Encounters:  08/09/15 178 lb 6.4 oz (80.922 kg)  04/10/15 176 lb (79.833 kg)  03/09/15 180 lb 8 oz (81.874 kg)    PHYSICAL EXAM BP 118/68 mmHg  Pulse 67  Ht '6\' 1"'$  (1.854 m)  Wt 178 lb 6.4  oz (80.922 kg)  BMI 23.54 kg/m2 General appearance: alert, cooperative, appears stated age, no distress and otherwise healthy-appearing Neck: no adenopathy, soft right carotid bruit and no JVD Lungs: clear to auscultation bilaterally, normal percussion bilaterally and non-labored Heart bradycardic with irregularly irregular rhythm. Frequent ectopy in bigeminy pattern S1& S2 normal, soft systolic ejection murmur at RUSB, click, rub or Soft S4, nondisplaced PMI  Abdomen: soft, non-tender; bowel sounds normal; no masses, no organomegaly; no HJR  Extremities: extremities normal, atraumatic, no cyanosis, or edema  Pulses: 2+ and symmetric;  Skin: normal and mobility and  turgor normal Neurologic: Mental status: Alert, oriented, thought content appropriate Cranial nerves: normal (II-XII grossly intact)    Adult ECG Report  Rate: 67 ;  Rhythm: normal sinus rhythm; inferior MI, age undetermined with scooped out ST segments/nonspecific changes.  Narrative Interpretation: Relatively stable EKG but PVCs no longer noted. Otherwise normal axis, intervals and durations.   Other studies Reviewed: Additional studies/ records that were reviewed today include:  Recent Labs:   Lab Results  Component Value Date   CHOL 98 10/10/2014   HDL 32.20* 10/10/2014   LDLCALC 46 10/10/2014   TRIG 100.0 10/10/2014   CHOLHDL 3 10/10/2014    ASSESSMENT / PLAN: Problem List Items Addressed This Visit    Unstable angina (HCC) (Chronic)    No recurrent angina symptoms since his 2 site PCI of the LAD. He does have some moderate disease in the circumflex and RCA but with no recurrent symptoms. No evidence of any myocardial injury despite severe LAD lesion.      Relevant Orders   EKG 12-Lead (Completed)   Type 2 diabetes mellitus with complication - CAD (Chronic)    Continue to be on metformin. Monitored by PCP. On ACE inhibitor.      Presence of drug coated stent in LAD coronary artery: Xience Alpine DES  2.75 mm x 18 mm (prox - 3.6 mm), 2.5 mm x 18 mm (~2.9 mm) mid (Chronic)   Relevant Orders   EKG 12-Lead (Completed)   Hyperlipidemia with target LDL less than 70 (Chronic)    Was not at goal at last check - but last available labs were from June 2016. Waiting for her PCP to follow-up. Should be due soon. Continue Crestor.      Relevant Orders   EKG 12-Lead (Completed)   Former heavy tobacco smoker; quit December 2015 (Chronic)   Relevant Orders   EKG 12-Lead (Completed)   Essential hypertension (Chronic)    Not actually an accurate diagnosis, he is only on low-dose ACE inhibitor.      Relevant Orders   EKG 12-Lead (Completed)   Cardiac murmur    No valve abnormalities noted on echo. May have some mild aortic sclerosis. No significant lesion to worry about.      CAD S/P 2 site PCI to proximal and mid LAD with a Xience DES stents - Primary (Chronic)    2 separate lesions disease treated with drug eluding stents. Seems to be doing relatively well. Not on beta blocker secondary to bradycardia. On low-dose ACE inhibitor secondary to borderline low blood pressure - for renal protection in setting of diabetes. He is on stable dose of Crestor. Remains on Effient (had dyspnea issues with Brilinta. No longer on aspirin  As long as he is doing well on Effient, he is fine staying on Effient as opposed to switching to Plavix. However we could switch if financially necessary.       Relevant Orders   EKG 12-Lead (Completed)      Current medicines are reviewed at length with the patient today. (+/- concerns) none The following changes have been made: none YOU MAY HOLD EFFIENT  IF NEEDED FOR PROCEDURES OR BRUISING - CALL OFFICE  Your physician wants you to follow-up in [place].   Studies Ordered:   Orders Placed This Encounter  Procedures  . EKG 12-Lead      Leonie Man, M.D., M.S. Interventional Cardiologist   Pager # [phone] Phone #  [phone] [address] [place],  [place] [address]

## 2015-08-12 ENCOUNTER — Encounter: Payer: Self-pay | Admitting: Cardiology

## 2015-08-12 ENCOUNTER — Other Ambulatory Visit: Payer: Self-pay | Admitting: Family Medicine

## 2015-08-12 DIAGNOSIS — E118 Type 2 diabetes mellitus with unspecified complications: Secondary | ICD-10-CM

## 2015-08-12 NOTE — Assessment & Plan Note (Addendum)
2 separate lesions disease treated with drug eluding stents. Seems to be doing relatively well. Not on beta blocker secondary to bradycardia. On low-dose ACE inhibitor secondary to borderline low blood pressure - for renal protection in setting of diabetes. He is on stable dose of Crestor. Remains on Effient (had dyspnea issues with Brilinta. No longer on aspirin  As long as he is doing well on Effient, he is fine staying on Effient as opposed to switching to Plavix. However we could switch if financially necessary.

## 2015-08-12 NOTE — Assessment & Plan Note (Signed)
Not actually an accurate diagnosis, he is only on low-dose ACE inhibitor.

## 2015-08-12 NOTE — Assessment & Plan Note (Signed)
Was not at goal at last check - but last available labs were from June 2016. Waiting for her PCP to follow-up. Should be due soon. Continue Crestor.

## 2015-08-12 NOTE — Assessment & Plan Note (Signed)
No valve abnormalities noted on echo. May have some mild aortic sclerosis. No significant lesion to worry about.

## 2015-08-12 NOTE — Assessment & Plan Note (Signed)
No recurrent angina symptoms since his 2 site PCI of the LAD. He does have some moderate disease in the circumflex and RCA but with no recurrent symptoms. No evidence of any myocardial injury despite severe LAD lesion.

## 2015-08-12 NOTE — Assessment & Plan Note (Signed)
Continue to be on metformin. Monitored by PCP. On ACE inhibitor.

## 2015-08-16 ENCOUNTER — Other Ambulatory Visit: Payer: Self-pay | Admitting: *Deleted

## 2015-08-16 ENCOUNTER — Other Ambulatory Visit: Payer: Self-pay | Admitting: Cardiology

## 2015-08-16 MED ORDER — METFORMIN HCL 500 MG PO TABS: ORAL_TABLET | ORAL | Status: DC

## 2015-08-16 MED ORDER — ESCITALOPRAM OXALATE 20 MG PO TABS: 20.0000 mg | ORAL_TABLET | Freq: Every day | ORAL | Status: DC

## 2015-08-16 NOTE — Telephone Encounter (Signed)
Rx refill sent to pharmacy. 

## 2015-08-20 ENCOUNTER — Other Ambulatory Visit: Payer: Self-pay | Admitting: Family Medicine

## 2015-08-20 ENCOUNTER — Other Ambulatory Visit (INDEPENDENT_AMBULATORY_CARE_PROVIDER_SITE_OTHER): Payer: BLUE CROSS/BLUE SHIELD

## 2015-08-20 DIAGNOSIS — E118 Type 2 diabetes mellitus with unspecified complications: Secondary | ICD-10-CM

## 2015-08-20 DIAGNOSIS — E119 Type 2 diabetes mellitus without complications: Secondary | ICD-10-CM

## 2015-08-20 DIAGNOSIS — Z119 Encounter for screening for infectious and parasitic diseases, unspecified: Secondary | ICD-10-CM

## 2015-08-20 LAB — COMPREHENSIVE METABOLIC PANEL
ALT: 9 U/L (ref 0–53)
AST: 14 U/L (ref 0–37)
Albumin: 4.1 g/dL (ref 3.5–5.2)
Alkaline Phosphatase: 52 U/L (ref 39–117)
BILIRUBIN TOTAL: 0.4 mg/dL (ref 0.2–1.2)
BUN: 10 mg/dL (ref 6–23)
CO2: 29 meq/L (ref 19–32)
CREATININE: 0.95 mg/dL (ref 0.40–1.50)
Calcium: 9.7 mg/dL (ref 8.4–10.5)
Chloride: 104 mEq/L (ref 96–112)
GFR: 85.02 mL/min (ref >60.00)
GLUCOSE: 105 mg/dL — AB (ref 70–99)
Potassium: 5 mEq/L (ref 3.5–5.1)
Sodium: 139 mEq/L (ref 135–145)
Total Protein: 7 g/dL (ref 6.0–8.3)

## 2015-08-20 LAB — LIPID PANEL
CHOL/HDL RATIO: 3
Cholesterol: 89 mg/dL (ref 0–200)
HDL: 31.9 mg/dL — AB (ref >39.00)
LDL Cholesterol: 24 mg/dL (ref 0–99)
NONHDL: 56.89
Triglycerides: 166 mg/dL — ABNORMAL HIGH (ref 0.0–149.0)
VLDL: 33.2 mg/dL (ref 0.0–40.0)

## 2015-08-20 LAB — HEMOGLOBIN A1C: Hgb A1c MFr Bld: 7.3 % — ABNORMAL HIGH (ref 4.6–6.5)

## 2015-08-21 LAB — HEPATITIS C ANTIBODY: HCV AB: NEGATIVE

## 2015-08-21 LAB — HIV ANTIBODY (ROUTINE TESTING W REFLEX): HIV: NONREACTIVE

## 2015-08-24 ENCOUNTER — Ambulatory Visit (INDEPENDENT_AMBULATORY_CARE_PROVIDER_SITE_OTHER): Payer: BLUE CROSS/BLUE SHIELD | Admitting: Family Medicine

## 2015-08-24 ENCOUNTER — Encounter: Payer: Self-pay | Admitting: Family Medicine

## 2015-08-24 VITALS — BP 108/68 | HR 74 | Ht 73.0 in | Wt 178.0 lb

## 2015-08-24 DIAGNOSIS — F32A Depression, unspecified: Secondary | ICD-10-CM

## 2015-08-24 DIAGNOSIS — E118 Type 2 diabetes mellitus with unspecified complications: Secondary | ICD-10-CM

## 2015-08-24 DIAGNOSIS — F329 Major depressive disorder, single episode, unspecified: Secondary | ICD-10-CM | POA: Diagnosis not present

## 2015-08-24 NOTE — Progress Notes (Signed)
Diabetes:  Using medications without difficulties: yes Hypoglycemic episodes: no Hyperglycemic episodes: no Feet problems: no Blood Sugars averaging: not checked.  eye exam within last year: due, d/w pt.   Busy- lots of exercise at work, healthy diet.    Mood is good, d/w pt.  No ADE on med.    HCV and HIV neg, d/w pt.   Tick bite Monday night.  R upper arm. Attached but not engorged.  He pulled it off.  Local irritation.  No other rash.  No other systemic sx.   Meds, vitals, and allergies reviewed.   ROS: Per HPI unless specifically indicated in ROS section   GEN: nad, alert and oriented HEENT: mucous membranes moist NECK: supple w/o LA CV: rrr. PULM: ctab, no inc wob ABD: soft, +bs EXT: no edema SKIN: no acute rash but 8mm area of irritation on the R upper arm w/o fluctuant mass.  No spreading erythema o/w.   Diabetic foot exam: Normal inspection No skin breakdown No calluses  Normal DP pulses Normal sensation to light touch and monofilament Nails normal except for thickened L 1st nail- partially cracked but appears to have a chance of growing out.

## 2015-08-24 NOTE — Patient Instructions (Addendum)
Call about an eye exam when possible.  Recheck A1c before a visit in about 6 months.   Take care.  Glad to see you.

## 2015-08-26 NOTE — Assessment & Plan Note (Signed)
Mood is good, d/w pt.  No ADE on med.  Continue as is.

## 2015-08-26 NOTE — Assessment & Plan Note (Signed)
A1c okay at 7.3, he isn't enthused about more meds.  Continue with work on diet and exercise. D/w pt.  Recheck A1c periodically.  He agrees.  About the tick bite, no abx for now.  Has no abx indication, likely on local irritation, he'll update me as needed- if any fever or other rash.  He agrees.

## 2015-10-17 ENCOUNTER — Other Ambulatory Visit: Payer: Self-pay | Admitting: Family Medicine

## 2015-11-02 ENCOUNTER — Ambulatory Visit: Payer: BLUE CROSS/BLUE SHIELD | Admitting: Family Medicine

## 2015-11-02 DIAGNOSIS — Z0289 Encounter for other administrative examinations: Secondary | ICD-10-CM

## 2015-11-21 ENCOUNTER — Telehealth: Payer: Self-pay | Admitting: Family Medicine

## 2015-11-21 ENCOUNTER — Other Ambulatory Visit: Payer: Self-pay | Admitting: Family Medicine

## 2015-11-21 NOTE — Telephone Encounter (Signed)
Sent.  Needs lab before visit in about 3 months.  Thanks.

## 2015-11-21 NOTE — Telephone Encounter (Signed)
Please call patient and scheduled appointments as instructed by Dr. Para March.

## 2015-11-21 NOTE — Telephone Encounter (Signed)
I sent the Rx to the pharmacy.

## 2015-11-23 NOTE — Telephone Encounter (Signed)
LEFT MESSAGE ASKING PT TO CALL OFFICE  

## 2015-11-27 NOTE — Telephone Encounter (Signed)
Lab 10/3

## 2015-11-27 NOTE — Telephone Encounter (Signed)
Please close

## 2015-12-06 IMAGING — CR DG CHEST 1V PORT
2 series · 2 of 2 positions shown · non-contrast
Comparison: May 18, 2013

CLINICAL DATA: Chest pain for 1 day

EXAM:
PORTABLE CHEST - 1 VIEW

[AP (1 of 2)]
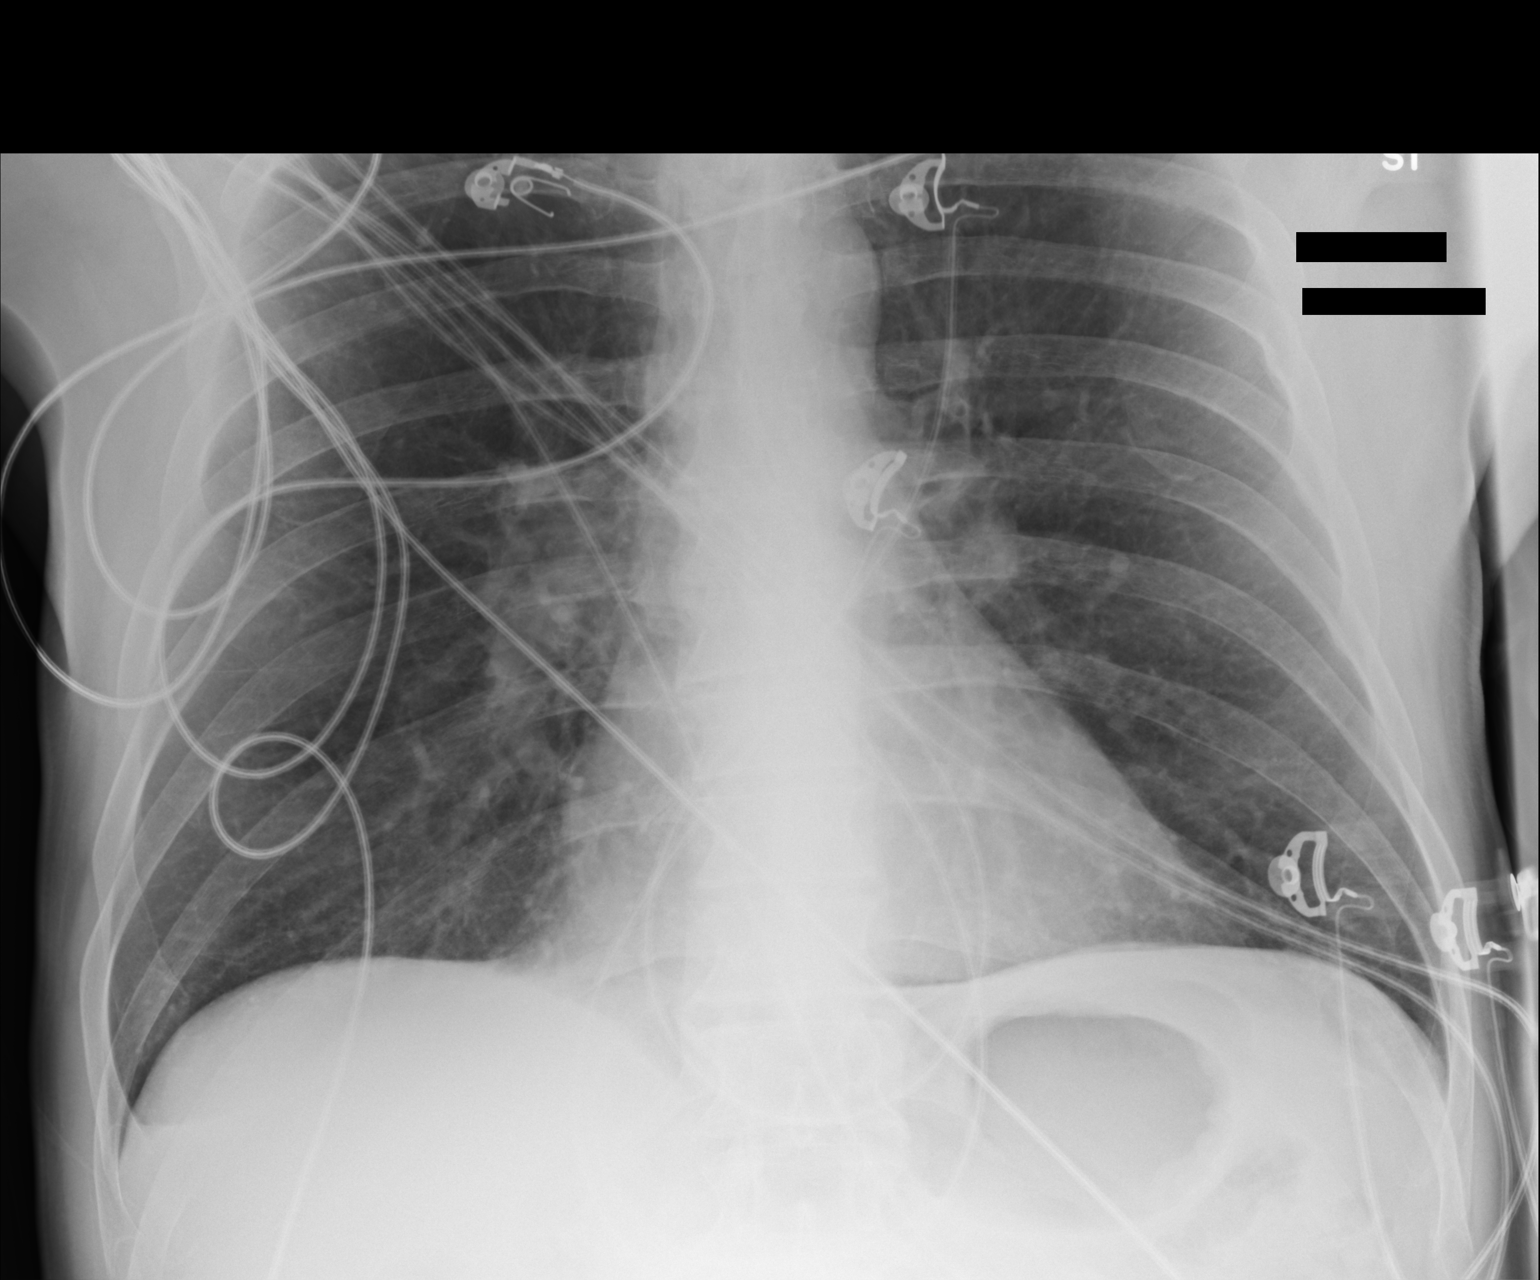

[AP (2 of 2)]
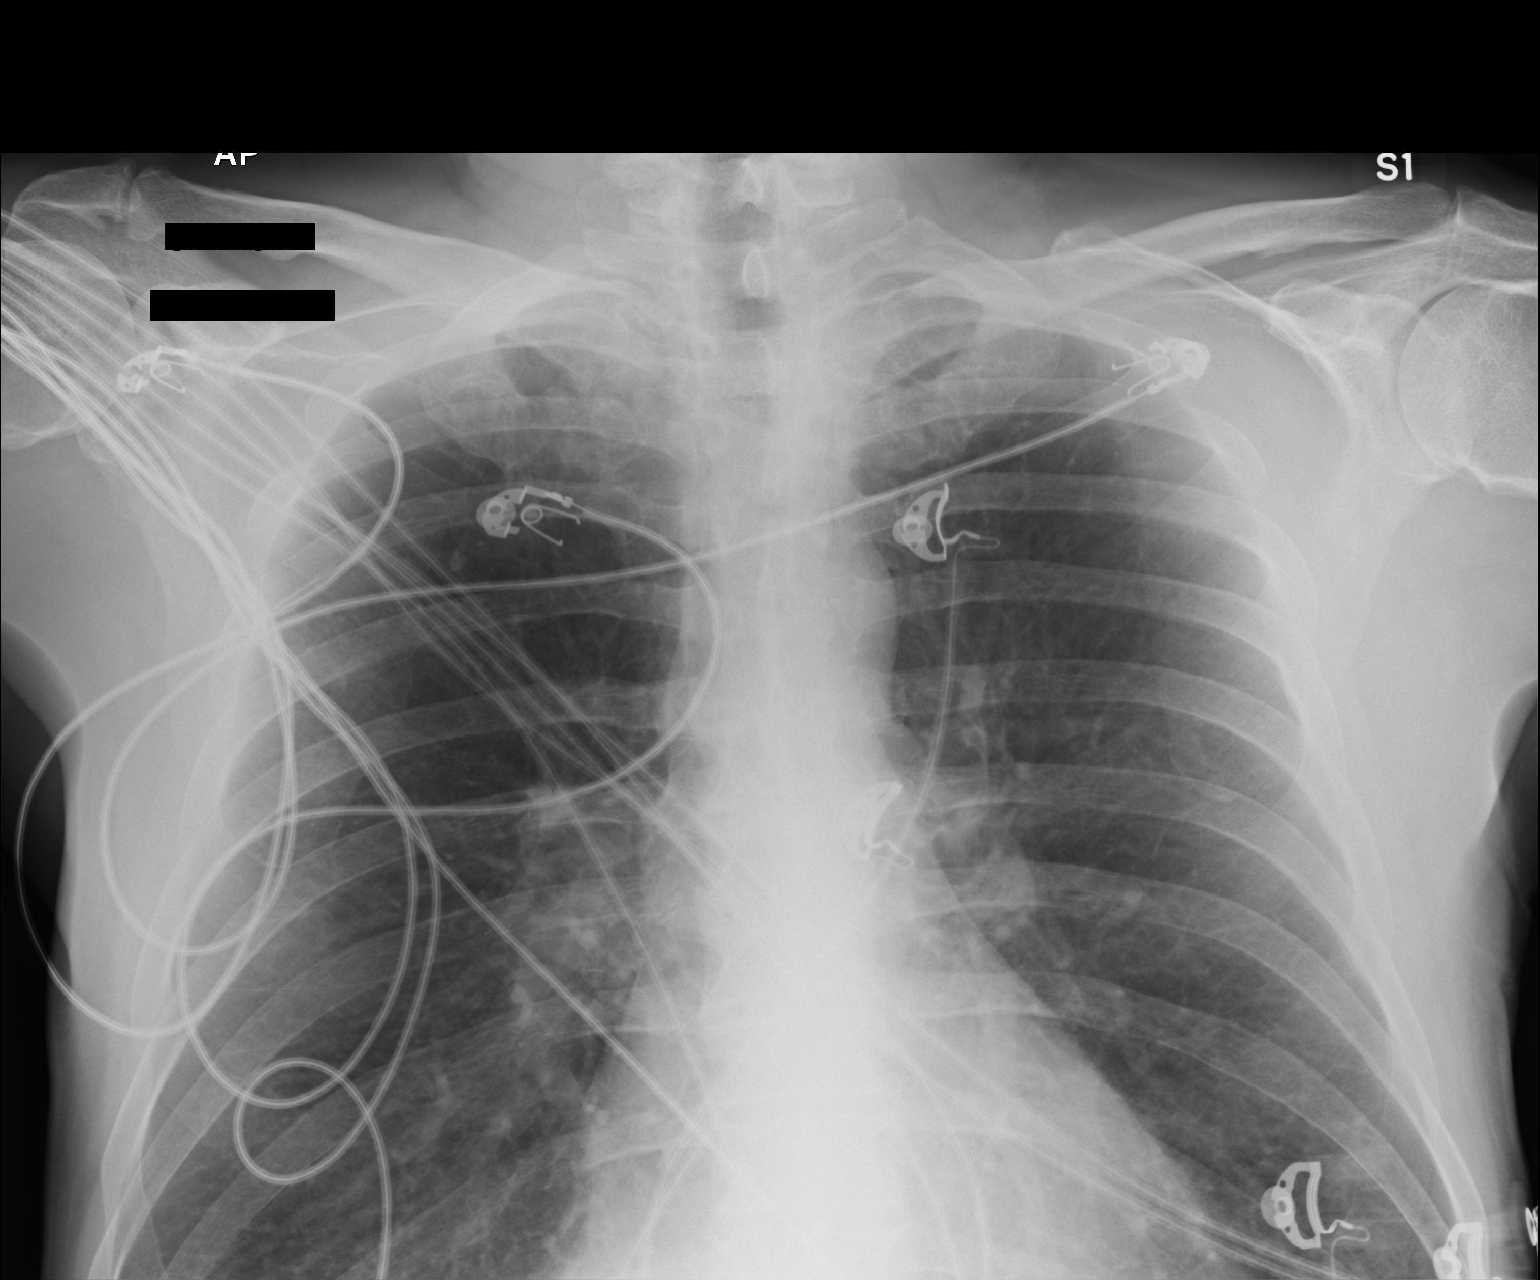

[2 of 2 positions shown; findings below may reference images not displayed]

FINDINGS: There is no edema or consolidation. There is underlying
emphysematous change. There are small calcified granulomas in the
right upper lobe. There is no edema or consolidation. Heart size is
normal. The pulmonary vascularity reflects underlying emphysema. No
adenopathy. No bone lesions.
IMPRESSION: Underlying emphysematous change with scattered small granulomas in
the right upper lobe. No edema or consolidation.

## 2016-01-21 ENCOUNTER — Other Ambulatory Visit: Payer: Self-pay | Admitting: *Deleted

## 2016-01-21 MED ORDER — NITROGLYCERIN 0.4 MG SL SUBL: 0.4000 mg | SUBLINGUAL_TABLET | SUBLINGUAL | 2 refills | Status: DC | PRN

## 2016-02-15 ENCOUNTER — Other Ambulatory Visit: Payer: Self-pay | Admitting: Family Medicine

## 2016-02-15 DIAGNOSIS — E118 Type 2 diabetes mellitus with unspecified complications: Secondary | ICD-10-CM

## 2016-02-25 ENCOUNTER — Other Ambulatory Visit (INDEPENDENT_AMBULATORY_CARE_PROVIDER_SITE_OTHER): Payer: BLUE CROSS/BLUE SHIELD

## 2016-02-25 DIAGNOSIS — E118 Type 2 diabetes mellitus with unspecified complications: Secondary | ICD-10-CM

## 2016-02-25 LAB — HEMOGLOBIN A1C: HEMOGLOBIN A1C: 6.7 % — AB (ref 4.6–6.5)

## 2016-03-03 ENCOUNTER — Ambulatory Visit (INDEPENDENT_AMBULATORY_CARE_PROVIDER_SITE_OTHER): Payer: BLUE CROSS/BLUE SHIELD | Admitting: Family Medicine

## 2016-03-03 ENCOUNTER — Encounter: Payer: Self-pay | Admitting: Family Medicine

## 2016-03-03 DIAGNOSIS — E118 Type 2 diabetes mellitus with unspecified complications: Secondary | ICD-10-CM

## 2016-03-03 NOTE — Assessment & Plan Note (Signed)
Check sugar a few times and then stop 1 of the metformin pills (quit the nighttime dose).  If sugar is not a lot higher then okay to continue as is.  If AM sugar is >120, then go back to the twice a day metformin dose.  He'll call about an eye exam.   Asked pt to check with insurance to see if they will cover the shingles shot. Recheck in about 6 months at a physical.   Update me as needed in the meantime.  He agrees.

## 2016-03-03 NOTE — Progress Notes (Signed)
Diabetes:  Using medications without difficulties: yes Hypoglycemic episodes: no sx Hyperglycemic episodes: no sx Feet problems:no Blood Sugars averaging: not checked.  eye exam within last year: due, d/w pt.  He's checking on coverage.  A1c d/w pt, now <7.  He has been working on diet, some weight loss noted, intentional.    Meds, vitals, and allergies reviewed.   ROS: Per HPI unless specifically indicated in ROS section   GEN: nad, alert and oriented HEENT: mucous membranes moist NECK: supple w/o LA CV: rrr. PULM: ctab, no inc wob ABD: soft, +bs EXT: no edema

## 2016-03-03 NOTE — Patient Instructions (Addendum)
Consider checking your sugar a few times and then stop 1 of the metformin pills (quit the nighttime dose).  If your sugar is not a lot higher then okay to continue as is.  If AM sugar is >120, then go back to the twice a day metformin dose.  Update me as needed.  Call about an eye exam.   Check with your insurance to see if they will cover the shingles shot. Take care.  Glad to see you.  Recheck in about 6 months at a physical.   Update me as needed in the meantime.

## 2016-03-03 NOTE — Progress Notes (Signed)
Pre visit review using our clinic review tool, if applicable. No additional management support is needed unless otherwise documented below in the visit note. 

## 2016-03-04 ENCOUNTER — Other Ambulatory Visit: Payer: Self-pay | Admitting: Cardiology

## 2016-04-03 ENCOUNTER — Other Ambulatory Visit: Payer: Self-pay | Admitting: Family Medicine

## 2016-04-09 ENCOUNTER — Telehealth: Payer: Self-pay | Admitting: Cardiology

## 2016-04-09 NOTE — Telephone Encounter (Signed)
Pt c/o of Chest Pain: 1. Are you having CP right now? Yes 2. Are you experiencing any other symptoms (ex. SOB, nausea, vomiting, sweating)? no 3. How long have you been experiencing CP?since Sunday  4. Is your CP continuous or coming and going?coming and going  5. Have you taken Nitroglycerin? no

## 2016-04-09 NOTE — Telephone Encounter (Signed)
Patient states been having chest discomfort of /on since Sunday. Has not used ntg. No shortness of breath, no radiation. Patient states pain is not bad to use ntg .   informed patient if active pain to use ntg  X 3 if no relief call 911. Patient states he set message for an appointment via mychart.  rn instructed patient to take a NTG NOW WHILE ON THE PHONE- since patient states he has alittle discomfort now - did not give a pain scale.  after 5 mins of us of ntg discomfort gone. Appointment made for tomorrow. restated to patient if discomfort return again and ntg  Up to 3 tablet  5 min apart does not relief discomfort call 911 and go to ER. Patient verbalized understanding.

## 2016-04-10 ENCOUNTER — Encounter: Payer: Self-pay | Admitting: Physician Assistant

## 2016-04-10 ENCOUNTER — Ambulatory Visit (INDEPENDENT_AMBULATORY_CARE_PROVIDER_SITE_OTHER): Payer: BLUE CROSS/BLUE SHIELD | Admitting: Physician Assistant

## 2016-04-10 VITALS — BP 134/80 | HR 57 | Ht 73.0 in | Wt 174.8 lb

## 2016-04-10 DIAGNOSIS — Z8249 Family history of ischemic heart disease and other diseases of the circulatory system: Secondary | ICD-10-CM

## 2016-04-10 DIAGNOSIS — R079 Chest pain, unspecified: Secondary | ICD-10-CM | POA: Diagnosis not present

## 2016-04-10 DIAGNOSIS — E785 Hyperlipidemia, unspecified: Secondary | ICD-10-CM

## 2016-04-10 NOTE — Patient Instructions (Addendum)
Medication Instructions:  Your physician recommends that you continue on your current medications as directed. Please refer to the Current Medication list given to you today.  Labwork: None   Testing/Procedures: Your physician has requested that you have an exercise stress myoview. For further information please visit [email or web address]. Please follow instruction sheet, as given.  Follow-Up: Your physician recommends that you schedule a follow-up appointment in: [facility]  Any Other Special Instructions Will Be Listed Below (If Applicable).  If you need a refill on your cardiac medications before your next appointment, please call your pharmacy.

## 2016-04-10 NOTE — Progress Notes (Signed)
Cardiology Office Note   Date:  04/10/2016   ID:  Tony Lynch, DOB March 23, 1951, MRN [id number]  PCP:  Elsie Stain, MD  Cardiologist:  Dr Ellyn Hack 08/12/2015 Rosaria Ferries, PA-C   Chief Complaint  Patient presents with  . Chest Pain    History of Present Illness: Tony Lynch is a 65 y.o. male with a history of pLAD & mLAD DES 2015, DM, Tob use, bradycardia so no BB, on ACE for renal protection w/ DM, cerebrovascular dz w/ R-CCA & R-ICA 100%  12/13, pt called c/o cp, appt made.  Tony Lynch presents for cardiac evaluation. He works at a golf course in the summer, [place] and stays active around the house.   He is able to walk 1/3 of a mile with hunting gear and a rifle. Did that last Saturday without problems. On Sunday, he developed chest pain at rest. Aching, 4/10. Initially took no meds. It did not feel right, lasted 1-2 hours at a time. Resolved without intervention, but kept coming back. Laying on L side made it worse. No change with deep inspiration or movement. It was not like his pre-PCI pain.  It did not go away. It kept coming back. He called the office and took SL NTG x 1 as requested. It helped his pain and the pain did not come back.   General health has been good. He gets about 6 hours of sleep per night, normal for him. He does not eat a high-fat diet. No longer smokes, no ETOH. Drinks coffee and some soda. Compliant with medications. 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 No hx exertional symptoms.    Past Medical History:  Diagnosis Date  . CAD S/P percutaneous coronary angioplasty 03/28/2014   PCI to proximal and mid LAD: mLAD - Xience Alpine DES 2.5 mm x 18 mm (3.0 mm), pLAD Xience Alpine DES 2.75 mm 18 mm (3.0 mm distal and 3.6 mm proximal.  . Depression   . Diabetes mellitus type II, controlled ([place])   . OA (osteoarthritis)    hip, back  . Tobacco abuse   . Unstable angina ([place]) 03/28/2014   EF 60-65% by LV  gram.    Past Surgical History:  Procedure Laterality Date  . EXCISIONAL HEMORRHOIDECTOMY  1980's  . INGUINAL HERNIA REPAIR Right ~ 2010  . LEFT HEART CATHETERIZATION WITH CORONARY ANGIOGRAM N/A 03/28/2014   Procedure: LEFT HEART CATHETERIZATION WITH CORONARY ANGIOGRAM;  Surgeon: Leonie Man, MD;  Location: [facility] CATH LAB;  Service: CV: pLAD 95% (after SP1 & D1), mLAD 75%, Cx becomes bifurcating OM -> inferior branch 60%, downward takeoff RCA with mid 40-50%.  Marland Kitchen PERCUTANEOUS CORONARY STENT INTERVENTION (PCI-S)  03/28/2014   Procedure: PERCUTANEOUS CORONARY STENT INTERVENTION (PCI-S);  Surgeon: Leonie Man, MD;  Location: [facility] CATH LAB;  Service: Cardiovascular;;  mLAD - Xience DES 2.5 mm x 18 mm (3.0 mm), pLAD Xience 2.75 mm x 18 mm (3.0 - 3.6 mm)   . TRANSTHORACIC ECHOCARDIOGRAM  06/2014   EF 60-65% - no RWMA.  Gr 1 DD. Mild LA & RA dilation    Current Outpatient Prescriptions  Medication Sig Dispense Refill  . EFFIENT 10 MG TABS tablet TAKE 1 TABLET BY MOUTH DAILY 30 each 11  . escitalopram (LEXAPRO) 20 MG tablet TAKE 1 TABLET BY MOUTH ONCE A DAY 90 tablet 1  . glucose monitoring kit (FREESTYLE) monitoring kit 1 each by Does not apply  route as needed for other. Dispense any model that is covered- dispense testing supplies for Q AC/ HS accuchecks- 1 month supply with one refil. 1 each 1  . lisinopril (PRINIVIL,ZESTRIL) 2.5 MG tablet TAKE 1 TABLET BY MOUTH DAILY 90 tablet 1  . metFORMIN (GLUCOPHAGE) 500 MG tablet TAKE 1 TABLET BY MOUTH TWICE A DAY WITH A MEAL 60 tablet 12  . nitroGLYCERIN (NITROSTAT) 0.4 MG SL tablet Place 1 tablet (0.4 mg total) under the tongue every 5 (five) minutes as needed for chest pain. 25 tablet 2  . rosuvastatin (CRESTOR) 20 MG tablet Take 1 tablet (20 mg total) by mouth daily. 30 tablet 5   No current facility-administered medications for this visit.     Allergies:   Patient has no known allergies.    Social History:  The patient  reports that he has quit  smoking. His smoking use included Cigarettes. He has a 69.00 pack-year smoking history. He has never used smokeless tobacco. He reports that he does not drink alcohol or use drugs.   Family History:  The patient's family history includes Cancer in his sister; Diabetes in his sister; Heart disease in his brother, mother, sister, sister, and sister; Stroke in his sister.    ROS:  Please see the history of present illness. All other systems are reviewed and negative.    PHYSICAL EXAM: VS:  BP 134/80   Pulse (!) 57   Ht '6\' 1"'$  (1.854 m)   Wt 174 lb 12.8 oz (79.3 kg)   BMI 23.06 kg/m  , BMI Body mass index is 23.06 kg/m. GEN: Well nourished, well developed, male in no acute distress  HEENT: normal for age  Neck: no JVD, bilateral carotid bruits versus radiation of murmur, no masses Cardiac: RRR; soft murmur, no rubs, or gallops Respiratory:  clear to auscultation bilaterally, normal work of breathing GI: soft, nontender, nondistended, + BS MS: no deformity or atrophy; no edema; distal pulses are 2+ in all 4 extremities   Skin: warm and dry, no rash Neuro:  Strength and sensation are intact Psych: euthymic mood, full affect   EKG:  EKG is ordered today. The ekg ordered today demonstrates sinus bradycardia, heart rate 57, no acute ischemic changes, no Q waves   ECHO: 06/2014 - Left ventricle: The cavity size was normal. Wall thickness was normal. Systolic function was normal. The estimated ejection fraction was in the range of 60% to 65%. Doppler parameters are consistent with abnormal left ventricular relaxation (grade 1 diastolic dysfunction). The E/e&' ratio is <8, suggesting normal LV filling pressure. - Left atrium: LA Volume/BSA= 35.0 ml/m2. The atrium was mildlydilated. - Right ventricle: Poorly visualized. Systolic function was normal. Lateral annulus peak S velocity: 15.1 cm/s. - Right atrium: The atrium was mildly dilated. - Atrial septum: Poorly  visualized. - Inferior vena cava: The vessel was normal in size. The respirophasic diameter changes were in the normal range (= 50%), consistent with normal central venous pressure. Impressions: - LVEF 60-65%, inadequate for wall motion assessment (frequent ventricular ectopy), normal wall thickness, diastolic dysfunction, normal LV filling pressure, biatrial enlargment, normal IVC size.  CATH:  03/28/2014 POST-OPERATIVE DIAGNOSIS:    Severe single-vessel disease involving the proximal and mid LAD with 95% proximal and 70% mid lesions both treated with a Xience alpine DES stents.  Moderate disease at 60% in the inferior branch of the lateral OM and 30-50% mild to moderate disease in the RCA.  Well-preserved LVEF with normal LVEDP. No wall motion abnormalities.  Recent Labs: 08/20/2015: ALT 9; BUN 10; Creatinine, Ser 0.95; Potassium 5.0; Sodium 139    Lipid Panel    Component Value Date/Time   CHOL 89 08/20/2015 0929   TRIG 166.0 (H) 08/20/2015 0929   HDL 31.90 (L) 08/20/2015 0929   CHOLHDL 3 08/20/2015 0929   VLDL 33.2 08/20/2015 0929   LDLCALC 24 08/20/2015 0929     Wt Readings from Last 3 Encounters:  04/10/16 174 lb 12.8 oz (79.3 kg)  03/03/16 175 lb (79.4 kg)  08/24/15 178 lb (80.7 kg)     Other studies Reviewed: Additional studies/ records that were reviewed today include: Office notes, hospital records and testing.  ASSESSMENT AND PLAN:  1.  Chest pain: He has a history of coronary artery disease. He is concerned about his symptoms. They are not like his previous cardiac symptoms, so I do not feel a cardiac catheterization is needed. However, it would be good to do an ischemic evaluation.   We will schedule an exercise Myoview. He is agreeable to this. Continue current therapy with Effient, Crestor, and lisinopril. He is not on a beta blocker because of resting bradycardia. He is on chronic Effient, and is not currently on aspirin. Further evaluation and  treatment will depend on the results.  2. Hyperlipidemia: He is compliant with his Crestor and does not have a high fat diet. Continue current therapy and follow-up with his primary care physician.  Current medicines are reviewed at length with the patient today.  The patient does not have concerns regarding medicines.  The following changes have been made:  no change  Labs/ tests ordered today include:   Orders Placed This Encounter  Procedures  . Myocardial Perfusion Imaging  . EKG 12-Lead     Disposition:   FU with Dr. Ellyn Hack  Signed, Douglas Smolinsky, Suanne Marker, PA-C  04/10/2016 3:15 PM    [place] Phone: [phone]; Fax: [phone]  This note was written with the assistance of speech recognition software. Please excuse any transcriptional errors.

## 2016-04-25 ENCOUNTER — Inpatient Hospital Stay (HOSPITAL_COMMUNITY): Admission: RE | Admit: 2016-04-25 | Payer: BLUE CROSS/BLUE SHIELD | Source: Ambulatory Visit

## 2016-05-07 ENCOUNTER — Encounter: Payer: Self-pay | Admitting: Family Medicine

## 2016-05-07 ENCOUNTER — Ambulatory Visit (INDEPENDENT_AMBULATORY_CARE_PROVIDER_SITE_OTHER): Payer: BLUE CROSS/BLUE SHIELD | Admitting: Family Medicine

## 2016-05-07 VITALS — BP 112/68 | HR 64 | Temp 98.1°F | Wt 174.0 lb

## 2016-05-07 DIAGNOSIS — I2510 Atherosclerotic heart disease of native coronary artery without angina pectoris: Secondary | ICD-10-CM

## 2016-05-07 DIAGNOSIS — B349 Viral infection, unspecified: Secondary | ICD-10-CM

## 2016-05-07 DIAGNOSIS — M79652 Pain in left thigh: Secondary | ICD-10-CM

## 2016-05-07 NOTE — Progress Notes (Signed)
Subjective:    Patient ID: Tony Lynch, male    DOB: 10/12/1950, 66 y.o.   MRN: [id number]  HPI This is a 66 yo male who presents today with 4 days of sore throat, feeling bad, some chills, no significant nasal drainage, no cough. No sputum. No SOB, no wheeze. Feels better today. No sick contacts. Former smoker. No history asthma. Gargled with hydrogen peroxide with some relief of pain.  Had pain in left thigh 4 days ago x 1 episode, was in bed, got up to walk off leg pain and had chest pain, relieved with 1 nitroglycerin. No further chest pain. No further leg pain. No rash. Sees cardiology, Dr. Herbie BaltimoreHarding. Has stress test scheduled for 05/14/16.   Was constipated x 2 days, this has resolved.   Past Medical History:  Diagnosis Date  . CAD S/P percutaneous coronary angioplasty 03/28/2014   PCI to proximal and mid LAD: mLAD - Xience Alpine DES 2.5 mm x 18 mm (3.0 mm), pLAD Xience Alpine DES 2.75 mm 18 mm (3.0 mm distal and 3.6 mm proximal.  . Depression   . Diabetes mellitus type II, controlled ([facility])   . OA (osteoarthritis)    hip, back  . Tobacco abuse   . Unstable angina ([facility]) 03/28/2014   EF 60-65% by LV gram.   Past Surgical History:  Procedure Laterality Date  . EXCISIONAL HEMORRHOIDECTOMY  1980's  . INGUINAL HERNIA REPAIR Right ~ 2010  . LEFT HEART CATHETERIZATION WITH CORONARY ANGIOGRAM N/A 03/28/2014   Procedure: LEFT HEART CATHETERIZATION WITH CORONARY ANGIOGRAM;  Surgeon: Marykay Lexavid W Harding, MD;  [facility];  Service: CV: pLAD 95% (after SP1 & D1), mLAD 75%, Cx becomes bifurcating OM -> inferior branch 60%, downward takeoff RCA with mid 40-50%.  Marland Kitchen. PERCUTANEOUS CORONARY STENT INTERVENTION (PCI-S)  03/28/2014   Procedure: PERCUTANEOUS CORONARY STENT INTERVENTION (PCI-S);  Surgeon: Marykay Lexavid W Harding, MD;  [facility];  Service: Cardiovascular;;  mLAD - Xience DES 2.5 mm x 18 mm (3.0 mm), pLAD Xience 2.75 mm x 18 mm (3.0 - 3.6 mm)   . TRANSTHORACIC ECHOCARDIOGRAM   06/2014   EF 60-65% - no RWMA.  Gr 1 DD. Mild LA & RA dilation   Family History  Problem Relation Age of Onset  . Diabetes Sister   . Heart disease Sister     died of MI  . Stroke Sister     2 strokes   . Heart disease Brother     multiple cardiac surgeries  . Heart disease Sister     stent  . Cancer Sister     breast cancer  . Heart disease Mother   . Heart disease Sister   . Prostate cancer Neg Hx   . Colon cancer Neg Hx    Social History  Substance Use Topics  . Smoking status: Former Smoker    Packs/day: 1.50    Years: 46.00    Types: Cigarettes  . Smokeless tobacco: Never Used  . Alcohol use No      Review of Systems Per HPI    Objective:   Physical Exam  Constitutional: He is oriented to person, place, and time. He appears well-developed and well-nourished. No distress.  HENT:  Head: Normocephalic and atraumatic.  Right Ear: Tympanic membrane, external ear and ear canal normal.  Left Ear: Tympanic membrane, external ear and ear canal normal.  Nose: Nose normal.  Mouth/Throat: Uvula is midline and mucous membranes are normal. Posterior oropharyngeal erythema present.  No oropharyngeal exudate or posterior oropharyngeal edema.  Eyes: Conjunctivae are normal.  Neck: Normal range of motion. Neck supple.  Cardiovascular: Normal rate, regular rhythm and normal heart sounds.   Pulmonary/Chest: Effort normal and breath sounds normal.  Lymphadenopathy:    He has no cervical adenopathy.  Neurological: He is alert and oriented to person, place, and time.  Skin: Skin is warm and dry. He is not diaphoretic.  Psychiatric: He has a normal mood and affect. His behavior is normal. Judgment and thought content normal.  Vitals reviewed.     BP 112/68 (BP Location: Left Arm, Patient Position: Sitting, Cuff Size: Normal)   Pulse 64   Temp 98.1 F (36.7 C) (Oral)   Wt 174 lb (78.9 kg) Comment: with shoes  SpO2 99%   BMI 22.96 kg/m  Wt Readings from Last 3 Encounters:    05/07/16 174 lb (78.9 kg)  04/10/16 174 lb 12.8 oz (79.3 kg)  03/03/16 175 lb (79.4 kg)       Assessment & Plan:  1. Nonspecific syndrome suggestive of viral illness - today he is afebrile and physical exam normal except for some erythema of pharynx.  - supportive measures, RTC precautions reviewed  2. Atherosclerosis of native coronary artery of native heart without angina pectoris - stress tess 05/14/16 as scheduled by cardiology  3. Acute pain of left thigh - this has resolved, he was instructed to RTC if persistent pain or other symptoms occur   Olean Ree, FNP-BC  [facility], [facility]  05/07/2016 8:48 AM

## 2016-05-07 NOTE — Patient Instructions (Signed)
I think you have a viral illness.   For cough you can try Delsym. Drink enough fluids to make your urine light yellow. For fever/chill/muscle aches you can take over the counter acetaminophen.   Please come back in if you are not better in 5-7 days or if you develop wheezing, shortness of breath or persistent vomiting.

## 2016-05-09 ENCOUNTER — Telehealth (HOSPITAL_COMMUNITY): Payer: Self-pay

## 2016-05-09 NOTE — Telephone Encounter (Signed)
Encounter complete. 

## 2016-05-14 ENCOUNTER — Encounter (HOSPITAL_COMMUNITY): Payer: BLUE CROSS/BLUE SHIELD

## 2016-07-09 ENCOUNTER — Ambulatory Visit: Payer: BLUE CROSS/BLUE SHIELD | Admitting: Cardiology

## 2016-07-17 ENCOUNTER — Ambulatory Visit (INDEPENDENT_AMBULATORY_CARE_PROVIDER_SITE_OTHER): Payer: BLUE CROSS/BLUE SHIELD | Admitting: Cardiology

## 2016-07-17 ENCOUNTER — Encounter: Payer: Self-pay | Admitting: Cardiology

## 2016-07-17 VITALS — BP 133/70 | HR 46 | Ht 74.0 in | Wt 174.0 lb

## 2016-07-17 DIAGNOSIS — E785 Hyperlipidemia, unspecified: Secondary | ICD-10-CM | POA: Diagnosis not present

## 2016-07-17 DIAGNOSIS — I2510 Atherosclerotic heart disease of native coronary artery without angina pectoris: Secondary | ICD-10-CM | POA: Diagnosis not present

## 2016-07-17 DIAGNOSIS — I200 Unstable angina: Secondary | ICD-10-CM

## 2016-07-17 DIAGNOSIS — I10 Essential (primary) hypertension: Secondary | ICD-10-CM | POA: Diagnosis not present

## 2016-07-17 DIAGNOSIS — Z9861 Coronary angioplasty status: Secondary | ICD-10-CM | POA: Diagnosis not present

## 2016-07-17 DIAGNOSIS — Z87891 Personal history of nicotine dependence: Secondary | ICD-10-CM

## 2016-07-17 NOTE — Progress Notes (Signed)
PCP: Tony Stain, MD  Clinic Note: Chief Complaint  Patient presents with  . Follow-up    yearly; Pt states no Sx.   . Coronary Artery Disease    HPI: Tony Lynch is a 66 y.o. male with a PMH below who presents today for Annual follow-up of his CAD with history of PCI to the LAD for unstable angina in December 2015. 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  Tony Lynch was last seen on December 14 by Rosaria Ferries, PA --> he had an episode of chest discomfort that was prolonged. Myoview stress test was ordered, but not done --> apparently, he had the Flu-like illness.  Cancelled ST.  Recent Hospitalizations: n/a  Studies Reviewed: n/a  Interval History: [name] presents today feeling quite well. He indicated that when he had that episode of discomfort in his chest in December, he must have been dealing with a viral illness because he had some fevers chills congestion and coughing. Once that cleared up, he has not had any further episodes of chest discomfort. He is fairly active playing golf and doing yard work. He denies any recurrence of chest tightness pressure with rest or exertion. No PND, orthopnea or edema. No resting or exertional dyspnea. No melena, hematochezia or hematuria. No easy bruising. No claudication.  The choice decided to stop taking his ACE inhibitor, and is trying to wean himself off of his Lexapro.  ROS: A comprehensive was performed. Review of Systems  Constitutional: Negative for chills, fever and malaise/fatigue.  Respiratory: Negative for cough and shortness of breath.   Cardiovascular: Negative.        Per HPI  Gastrointestinal: Negative for blood in stool and melena.  Genitourinary: Negative for hematuria.  Musculoskeletal: Negative for joint pain and myalgias.  Neurological: Negative for dizziness.  Psychiatric/Behavioral: Negative for memory loss.       Trying to wean off of Lexapro (self wean)    All other systems reviewed and are negative.   Past Medical History:  Diagnosis Date  . CAD S/P percutaneous coronary angioplasty 03/28/2014   PCI to proximal and mid LAD: mLAD - Xience Alpine DES 2.5 mm x 18 mm (3.0 mm), pLAD Xience Alpine DES 2.75 mm 18 mm (3.0 mm distal and 3.6 mm proximal.  . Depression   . Diabetes mellitus type II, controlled ([place])   . OA (osteoarthritis)    hip, back  . Tobacco abuse   . Unstable angina ([place]) 03/28/2014   EF 60-65% by LV gram.    Past Surgical History:  Procedure Laterality Date  . EXCISIONAL HEMORRHOIDECTOMY  1980's  . INGUINAL HERNIA REPAIR Right ~ 2010  . LEFT HEART CATHETERIZATION WITH CORONARY ANGIOGRAM N/A 03/28/2014   Procedure: LEFT HEART CATHETERIZATION WITH CORONARY ANGIOGRAM;  Surgeon: Leonie Man, MD;  [facility]: [facility] CATH LAB;  Service: CV: pLAD 95% (after SP1 & D1), mLAD 75%, Cx becomes bifurcating OM -> inferior branch 60%, downward takeoff RCA with mid 40-50%.  Marland Kitchen PERCUTANEOUS CORONARY STENT INTERVENTION (PCI-S)  03/28/2014   Procedure: PERCUTANEOUS CORONARY STENT INTERVENTION (PCI-S);  Surgeon: Leonie Man, MD;  Location: [facility] CATH LAB;  Service: Cardiovascular;;  mLAD - Xience DES 2.5 mm x 18 mm (3.0 mm), pLAD Xience 2.75 mm x 18 mm (3.0 - 3.6 mm)   . TRANSTHORACIC ECHOCARDIOGRAM  06/2014   EF 60-65% - no RWMA.  Gr 1 DD. Mild LA & RA dilation  Current Meds  Medication Sig  . EFFIENT 10 MG TABS tablet TAKE 1 TABLET BY MOUTH DAILY  . escitalopram (LEXAPRO) 20 MG tablet TAKE 1 TABLET BY MOUTH ONCE A DAY  . glucose monitoring kit (FREESTYLE) monitoring kit 1 each by Does not apply route as needed for other. Dispense any model that is covered- dispense testing supplies for Q AC/ HS accuchecks- 1 month supply with one refil.  . metFORMIN (GLUCOPHAGE) 500 MG tablet TAKE 1 TABLET BY MOUTH TWICE A DAY WITH A MEAL  . nitroGLYCERIN (NITROSTAT) 0.4 MG SL tablet Place 1 tablet (0.4 mg total) under the tongue every 5 (five) minutes as  needed for chest pain.  . rosuvastatin (CRESTOR) 20 MG tablet Take 1 tablet (20 mg total) by mouth daily.    No Known Allergies  Social History   Social History  . Marital status: Divorced    Spouse name: N/A  . Number of children: N/A  . Years of education: N/A   Social History Main Topics  . Smoking status: Former Smoker    Packs/day: 1.50    Years: 46.00    Types: Cigarettes  . Smokeless tobacco: Never Used  . Alcohol use No  . Drug use: No  . Sexual activity: Not Asked   Other Topics Concern  . None   Social History Narrative   Smoker 1.5 ppd x 50 years    Lives alone    1 son and 1 daughter, 2 grandsons, divorced    Works at Systems analyst course full time    5/6 siblings alive    [employer] fan, Clinical biochemist fan    family history includes Cancer in his sister; Diabetes in his sister; Heart disease in his brother, mother, sister, sister, and sister; Stroke in his sister.  Wt Readings from Last 3 Encounters:  07/17/16 78.9 kg (174 lb)  05/07/16 78.9 kg (174 lb)  04/10/16 79.3 kg (174 lb 12.8 oz)    PHYSICAL EXAM BP 133/70   Pulse (!) 46   Ht 6\' 2"  (1.88 m)   Wt 78.9 kg (174 lb)   BMI 22.34 kg/m  General appearance: alert, cooperative, appears stated age, no distress and otherwise healthy-appearing Neck: no adenopathy, soft right carotid bruit and no JVD Lungs: clear to auscultation bilaterally, normal percussion bilaterally and non-labored Heart bradycardic with irregularly irregular rhythm. Frequent ectopy in bigeminy pattern S1& S2 normal, soft systolic ejection murmur at RUSB, click, rub or Soft S4, nondisplaced PMI  Abdomen: soft, non-tender; bowel sounds normal; no masses, no organomegaly; no HJR  Extremities: extremities normal, atraumatic, no cyanosis, or edema  Pulses: 2+ and symmetric;  Skin: normal and mobility and turgor normal Neurologic: Mental status: Alert, oriented, thought content appropriate    Adult ECG Report  Rate: 46 ;  Rhythm: sinus  bradycardia and otherwise normal axis, intervals & durations.;   Narrative Interpretation: Otherwise normal EKG   Other studies Reviewed: Additional studies/ records that were reviewed today include:  Recent Labs:  PCP to check next week. (will as for copy)    ASSESSMENT / PLAN: Problem List Items Addressed This Visit    CAD S/P 2 site PCI to proximal and mid LAD with a Xience DES stents - Primary (Chronic)    LAD disease with 2 separate lesions treated with drug-eluting stents. Remains on Effient without bleeding issues. Not on aspirin. He has a refill of his nitroglycerin, but has not used it since December. He is on Crestor. Not on beta blocker due to  resting bradycardia. He is posted on ACE inhibitor and I recommended he goes back on ACE inhibitor.      Essential hypertension (Chronic)    Fairly well controlled. Was more on lisinopril for his renal protective issues with diabetes than anything else. I recommend that he goes back on low-dose of it unless he has significant orthostatic dizziness.      Relevant Orders   EKG 12-Lead   Former heavy tobacco smoker; quit December 2015 (Chronic)    Doing fairly well with his abstinence despite being surrounded by people who smoke. He has definite aroma of cigarettes, but indicates that he has not had a cigarette.      Hyperlipidemia with target LDL less than 70 (Chronic)    Is due to have labs checked by his PCP. I simply asked that they send copy to me. He is currently on Crestor. Goal LDL would be definitely less than 70. Low threshold for increasing therapy      Unstable angina ([facility]CC) (Chronic)    Interestingly, his angina symptoms was really just has a general sense of the knees and profuse sweating. He has not had any of these symptoms since. Has moderate disease in the circumflex and RCA has been asymptomatic with that after his PCI to LAD.         Current medicines are reviewed at length with the patient today. (+/-  concerns) n/a - not taking his Lisinopril The following changes have been made: recommended taking Lisinopril  Patient Instructions  Medication Instructions: Your physician recommends that you continue on your current medications as directed. Please refer to the Current Medication list given to you today.   Follow-Up: Your physician wants you to follow-up in: 1 year with Dr. Ellyn Hack. You will receive a reminder letter in the mail two months in advance. If you don't receive a letter, please call our office to schedule the follow-up appointment.   Any Other Special Instructions will be listed below:  Please have your Primary doctor fax lab results to [phone] ATTN: Dr. Ellyn Hack.   If you need a refill on your cardiac medications before your next appointment, please call your pharmacy.    Studies Ordered:   Orders Placed This Encounter  Procedures  . EKG 12-Lead      Glenetta Hew, M.D., M.S. Interventional Cardiologist   Pager # [phone] Phone # [phone] [address] [place], [place] [address]

## 2016-07-17 NOTE — Assessment & Plan Note (Signed)
Interestingly, his angina symptoms was really just has a general sense of the knees and profuse sweating. He has not had any of these symptoms since. Has moderate disease in the circumflex and RCA has been asymptomatic with that after his PCI to LAD.

## 2016-07-17 NOTE — Assessment & Plan Note (Signed)
LAD disease with 2 separate lesions treated with drug-eluting stents. Remains on Effient without bleeding issues. Not on aspirin. He has a refill of his nitroglycerin, but has not used it since December. He is on Crestor. Not on beta blocker due to resting bradycardia. He is posted on ACE inhibitor and I recommended he goes back on ACE inhibitor.

## 2016-07-17 NOTE — Assessment & Plan Note (Signed)
Doing fairly well with his abstinence despite being surrounded by people who smoke. He has definite aroma of cigarettes, but indicates that he has not had a cigarette.

## 2016-07-17 NOTE — Assessment & Plan Note (Signed)
Fairly well controlled. Was more on lisinopril for his renal protective issues with diabetes than anything else. I recommend that he goes back on low-dose of it unless he has significant orthostatic dizziness.

## 2016-07-17 NOTE — Assessment & Plan Note (Signed)
Is due to have labs checked by his PCP. I simply asked that they send copy to me. He is currently on Crestor. Goal LDL would be definitely less than 70. Low threshold for increasing therapy

## 2016-07-17 NOTE — Patient Instructions (Signed)
Medication Instructions: Your physician recommends that you continue on your current medications as directed. Please refer to the Current Medication list given to you today.   Follow-Up: Your physician wants you to follow-up in: 1 year with Dr. Herbie BaltimoreHarding. You will receive a reminder letter in the mail two months in advance. If you don't receive a letter, please call our office to schedule the follow-up appointment.   Any Other Special Instructions will be listed below:  Please have your Primary doctor fax lab results to [phone] ATTN: Dr. Herbie BaltimoreHarding.   If you need a refill on your cardiac medications before your next appointment, please call your pharmacy.

## 2016-08-27 ENCOUNTER — Other Ambulatory Visit: Payer: Self-pay | Admitting: Cardiology

## 2016-08-30 ENCOUNTER — Encounter (HOSPITAL_COMMUNITY): Payer: Self-pay

## 2016-08-30 ENCOUNTER — Emergency Department (HOSPITAL_COMMUNITY)
Admission: EM | Admit: 2016-08-30 | Discharge: 2016-08-30 | Disposition: A | Discharge status: Home / Self Care | Payer: BLUE CROSS/BLUE SHIELD | Attending: Emergency Medicine | Admitting: Emergency Medicine

## 2016-08-30 ENCOUNTER — Emergency Department (HOSPITAL_COMMUNITY): Payer: BLUE CROSS/BLUE SHIELD

## 2016-08-30 DIAGNOSIS — I10 Essential (primary) hypertension: Secondary | ICD-10-CM | POA: Insufficient documentation

## 2016-08-30 DIAGNOSIS — Z87891 Personal history of nicotine dependence: Secondary | ICD-10-CM | POA: Diagnosis not present

## 2016-08-30 DIAGNOSIS — Y999 Unspecified external cause status: Secondary | ICD-10-CM | POA: Diagnosis not present

## 2016-08-30 DIAGNOSIS — S61210A Laceration without foreign body of right index finger without damage to nail, initial encounter: Secondary | ICD-10-CM | POA: Insufficient documentation

## 2016-08-30 DIAGNOSIS — Y939 Activity, unspecified: Secondary | ICD-10-CM | POA: Diagnosis not present

## 2016-08-30 DIAGNOSIS — Z23 Encounter for immunization: Secondary | ICD-10-CM | POA: Insufficient documentation

## 2016-08-30 DIAGNOSIS — W28XXXA Contact with powered lawn mower, initial encounter: Secondary | ICD-10-CM | POA: Insufficient documentation

## 2016-08-30 DIAGNOSIS — E119 Type 2 diabetes mellitus without complications: Secondary | ICD-10-CM | POA: Insufficient documentation

## 2016-08-30 DIAGNOSIS — Y929 Unspecified place or not applicable: Secondary | ICD-10-CM | POA: Diagnosis not present

## 2016-08-30 DIAGNOSIS — I2510 Atherosclerotic heart disease of native coronary artery without angina pectoris: Secondary | ICD-10-CM | POA: Diagnosis not present

## 2016-08-30 DIAGNOSIS — Z7984 Long term (current) use of oral hypoglycemic drugs: Secondary | ICD-10-CM | POA: Diagnosis not present

## 2016-08-30 MED ORDER — LIDOCAINE HCL (PF) 1 % IJ SOLN
5.0000 mL | Freq: Once | INTRAMUSCULAR | Status: AC
  Administered 2016-08-30: 5 mL
  Filled 2016-08-30: qty 5

## 2016-08-30 MED ORDER — TETANUS-DIPHTH-ACELL PERTUSSIS 5-2.5-18.5 LF-MCG/0.5 IM SUSP
0.5000 mL | Freq: Once | INTRAMUSCULAR | Status: AC
  Administered 2016-08-30: 0.5 mL via INTRAMUSCULAR
  Filled 2016-08-30: qty 0.5

## 2016-08-30 MED ORDER — CEPHALEXIN 500 MG PO CAPS: 500.0000 mg | ORAL_CAPSULE | Freq: Two times a day (BID) | ORAL | 0 refills | Status: AC

## 2016-08-30 MED ORDER — HYDROCODONE-ACETAMINOPHEN 5-325 MG PO TABS
1.0000 | ORAL_TABLET | Freq: Once | ORAL | Status: AC
Start: 2016-08-30 — End: 2016-08-30
  Administered 2016-08-30: 1 via ORAL
  Filled 2016-08-30: qty 1

## 2016-08-30 MED ORDER — CEPHALEXIN 250 MG PO CAPS
500.0000 mg | ORAL_CAPSULE | Freq: Once | ORAL | Status: AC
  Administered 2016-08-30: 500 mg via ORAL
  Filled 2016-08-30: qty 2

## 2016-08-30 NOTE — ED Provider Notes (Signed)
[facility] DEPT Provider Note   CSN: [id number] Arrival date & time: 08/30/16  [name]     History   Chief Complaint Chief Complaint  Patient presents with  . Extremity Laceration    HPI Tony Lynch is a 66 y.o. male.  Patient was working on his lawnmower with it lifted on a jack and when lowering it the mower landed on right hand and patient with injury to right index finger. Stopped bleeding.    The history is provided by the patient.  Illness  This is a new problem. The current episode started less than 1 hour ago. The problem occurs constantly. The problem has not changed since onset.Pertinent negatives include no chest pain, no abdominal pain, no headaches and no shortness of breath. Nothing aggravates the symptoms. Nothing relieves the symptoms. He has tried nothing for the symptoms. The treatment provided no relief.    Past Medical History:  Diagnosis Date  . CAD S/P percutaneous coronary angioplasty 03/28/2014   PCI to proximal and mid LAD: mLAD - Xience Alpine DES 2.5 mm x 18 mm (3.0 mm), pLAD Xience Alpine DES 2.75 mm 18 mm (3.0 mm distal and 3.6 mm proximal.  . Depression   . Diabetes mellitus type II, controlled ([place])   . OA (osteoarthritis)    hip, back  . Tobacco abuse   . Unstable angina ([place]) 03/28/2014   EF 60-65% by LV gram.    Patient Active Problem List   Diagnosis Date Noted  . Viral URI with cough 03/09/2015  . Acute lumbar back pain 03/09/2015  . Sebaceous cyst 10/15/2014  . Cardiac murmur 06/29/2014  . Bruit of right carotid artery 06/29/2014  . Encounter for long-term (current) use of medications 06/29/2014  . Hyperlipidemia with target LDL less than 70 06/29/2014  . Bradycardia 03/28/2014  . OA (osteoarthritis) 03/28/2014  . Unstable angina ([place]) 03/28/2014  . CAD S/P 2 site PCI to proximal and mid LAD with a Xience DES stents 03/28/2014    Class: Status post  . Presence of drug coated stent in LAD coronary artery: Xience Alpine DES  2.75 mm x 18 mm (prox - 3.6 mm), 2.5 mm x 18 mm (~2.9 mm) mid 03/28/2014    Class: Status post  . Type 2 diabetes mellitus with complication - CAD 33/83/2919  . Essential hypertension 06/16/2013  . Depression 06/16/2013  . Former heavy tobacco smoker; quit December 2015 06/16/2013    Past Surgical History:  Procedure Laterality Date  . EXCISIONAL HEMORRHOIDECTOMY  1980's  . INGUINAL HERNIA REPAIR Right ~ 2010  . LEFT HEART CATHETERIZATION WITH CORONARY ANGIOGRAM N/A 03/28/2014   Procedure: LEFT HEART CATHETERIZATION WITH CORONARY ANGIOGRAM;  Surgeon: Leonie Man, MD;  Location: [facility] CATH LAB;  Service: CV: pLAD 95% (after SP1 & D1), mLAD 75%, Cx becomes bifurcating OM -> inferior branch 60%, downward takeoff RCA with mid 40-50%.  Marland Kitchen PERCUTANEOUS CORONARY STENT INTERVENTION (PCI-S)  03/28/2014   Procedure: PERCUTANEOUS CORONARY STENT INTERVENTION (PCI-S);  Surgeon: Leonie Man, MD;  Location: [facility] CATH LAB;  Service: Cardiovascular;;  mLAD - Xience DES 2.5 mm x 18 mm (3.0 mm), pLAD Xience 2.75 mm x 18 mm (3.0 - 3.6 mm)   . TRANSTHORACIC ECHOCARDIOGRAM  06/2014   EF 60-65% - no RWMA.  Gr 1 DD. Mild LA & RA dilation       Home Medications    Prior to Admission medications   Medication Sig Start Date End Date Taking? Authorizing Provider  cephALEXin (  KEFLEX) 500 MG capsule Take 1 capsule (500 mg total) by mouth 2 (two) times daily. 08/30/16 09/06/16  Ishani Goldwasser, DO  escitalopram (LEXAPRO) 20 MG tablet TAKE 1 TABLET BY MOUTH ONCE A DAY 11/21/15   Tonia Ghent, MD  glucose monitoring kit (FREESTYLE) monitoring kit 1 each by Does not apply route as needed for other. Dispense any model that is covered- dispense testing supplies for Q AC/ HS accuchecks- 1 month supply with one refil. 06/16/13   Lorayne Marek, MD  metFORMIN (GLUCOPHAGE) 500 MG tablet TAKE 1 TABLET BY MOUTH TWICE A DAY WITH A MEAL 11/21/15   Tonia Ghent, MD  nitroGLYCERIN (NITROSTAT) 0.4 MG SL tablet Place 1 tablet (0.4 mg  total) under the tongue every 5 (five) minutes as needed for chest pain. 01/21/16   Tonia Ghent, MD  prasugrel (EFFIENT) 10 MG TABS tablet TAKE 1 TABLET BY MOUTH DAILY 08/27/16   Leonie Man, MD  rosuvastatin (CRESTOR) 20 MG tablet Take 1 tablet (20 mg total) by mouth daily. 06/19/15   Leonie Man, MD    Family History Family History  Problem Relation Age of Onset  . Diabetes Sister   . Heart disease Sister     died of MI  . Stroke Sister     2 strokes   . Heart disease Brother     multiple cardiac surgeries  . Heart disease Sister     stent  . Cancer Sister     breast cancer  . Heart disease Mother   . Heart disease Sister   . Prostate cancer Neg Hx   . Colon cancer Neg Hx     Social History Social History  Substance Use Topics  . Smoking status: Former Smoker    Packs/day: 1.50    Years: 46.00    Types: Cigarettes  . Smokeless tobacco: Never Used  . Alcohol use No     Allergies   Patient has no known allergies.   Review of Systems Review of Systems  Constitutional: Negative for chills and fever.  HENT: Negative for ear pain.   Eyes: Negative for pain and visual disturbance.  Respiratory: Negative for cough and shortness of breath.   Cardiovascular: Negative for chest pain and palpitations.  Gastrointestinal: Negative for abdominal pain.  Genitourinary: Negative for dysuria and hematuria.  Musculoskeletal: Negative for arthralgias and back pain.  Skin: Positive for wound. Negative for color change and rash.  Neurological: Negative for seizures, syncope and headaches.  All other systems reviewed and are negative.    Physical Exam Updated Vital Signs BP (!) 142/74   Pulse (!) 49   Temp 98.2 F (36.8 C) (Oral)   Resp 20   Ht _0  (1.854 m)   Wt 77.1 kg   SpO2 99%   BMI 22.43 kg/m   Physical Exam  Constitutional: He appears well-developed and well-nourished.  HENT:  Head: Normocephalic and atraumatic.  Eyes: Conjunctivae are normal.    Neck: Neck supple.  Cardiovascular: Normal rate and regular rhythm.   No murmur heard. Pulmonary/Chest: Effort normal and breath sounds normal. No respiratory distress.  Abdominal: Soft. There is no tenderness.  Musculoskeletal: Normal range of motion. He exhibits tenderness (TTP throughout right index finger). He exhibits no edema or deformity.  Normal ROM of right index finger and hand  Neurological: He is alert.  Normal strength and sensation in right hand and fingers  Skin: Skin is warm and dry.  Complex laceration to right index  finger with skin avulsion and 4 cm laceration, hemostatic  Psychiatric: He has a normal mood and affect.  Nursing note and vitals reviewed.    ED Treatments / Results  Labs (all labs ordered are listed, but only abnormal results are displayed) Labs Reviewed - No data to display  EKG  EKG Interpretation None       Radiology Dg Hand Complete Right  Result Date: 08/30/2016 CLINICAL DATA:  Laceration to the right index finger from lawn mower. Initial encounter. Is EXAM: RIGHT HAND - COMPLETE 3+ VIEW COMPARISON:  None. FINDINGS: There is no evidence of fracture or dislocation. There is no evidence of osseous disruption. The joint spaces are preserved. The carpal rows are intact, and demonstrate normal alignment. A soft tissue laceration is noted along the second digit, without radiopaque foreign body. Likely minimal calcification is noted overlying the fifth metacarpophalangeal joint. IMPRESSION: No evidence of fracture or dislocation. No radiopaque foreign bodies seen. Electronically Signed   By: Roanna Raider M.D.   On: 08/30/2016 17:33    Procedures .Marland KitchenLaceration Repair Date/Time: 08/30/2016 6:07 PM Performed by: Lockie Mola, Laquan Ludden Authorized by: Blane Ohara   Consent:    Consent obtained:  Verbal   Consent given by:  Patient   Risks discussed:  Infection, need for additional repair, nerve damage, pain, poor cosmetic result, poor wound healing,  retained foreign body, tendon damage and vascular damage   Alternatives discussed:  No treatment Anesthesia (see MAR for exact dosages):    Anesthesia method:  Local infiltration and nerve block   Local anesthetic:  Lidocaine 1% w/o epi   Block location:  Right index finger   Block needle gauge:  25 G   Block anesthetic:  Lidocaine 1% w/o epi   Block injection procedure:  Anatomic landmarks identified, anatomic landmarks palpated, introduced needle, negative aspiration for blood and incremental injection   Block outcome:  Anesthesia achieved Laceration details:    Location:  Finger   Finger location:  R index finger   Length (cm):  4   Depth (mm):  1 Repair type:    Repair type:  Complex Pre-procedure details:    Preparation:  Imaging obtained to evaluate for foreign bodies and patient was prepped and draped in usual sterile fashion Exploration:    Limited defect created (wound extended): yes   Treatment:    Area cleansed with:  Betadine and saline   Amount of cleaning:  Standard   Irrigation solution:  Sterile saline   Irrigation method:  Syringe   Visualized foreign bodies/material removed: no     Debridement:  None   Undermining:  None Skin repair:    Repair method:  Sutures   Suture size:  4-0   Wound skin closure material used: ethilon.   Suture technique:  Simple interrupted   Number of sutures:  8 Approximation:    Approximation:  Close Post-procedure details:    Dressing:  Antibiotic ointment and non-adherent dressing   Patient tolerance of procedure:  Tolerated well, no immediate complications   (including critical care time)  Medications Ordered in ED Medications  HYDROcodone-acetaminophen (NORCO/VICODIN) 5-325 MG per tablet 1 tablet (1 tablet Oral Given 08/30/16 1701)  Tdap (BOOSTRIX) injection 0.5 mL (0.5 mLs Intramuscular Given 08/30/16 1701)  lidocaine (PF) (XYLOCAINE) 1 % injection 5 mL (5 mLs Infiltration Given 08/30/16 1733)  cephALEXin (KEFLEX) capsule 500  mg (500 mg Oral Given 08/30/16 1743)     Initial Impression / Assessment and Plan / ED Course  I have  reviewed the triage vital signs and the nursing notes.  Pertinent labs & imaging results that were available during my care of the patient were reviewed by me and considered in my medical decision making (see chart for details).     Tony Lynch is a 66 year old male with no significant medical history who presents to the ED with right finger laceration. Patient had lawnmower fall on his right index finger and patient has hemostatic laceration. Patient with no obvious joint involvement or tendon or muscle disruption. Patient has complex laceration of her right index finger with part of laceration with avulsion of skin and other part of laceration about 4 cm in length. Patient is neurovascularly intact. Normal motor function of right fingers. Patient given tetanus shot and x-rays obtained that showed no fracture, malalignment, foreign body. Laceration was repaired and patient instructed on suture removal follow-up and to watch for signs of infection. Patient given, keflex in the ED and given prescription for keflex for ppx. Told to return to the ED if symptoms worsen and recommend follow-up w/ primary care provider. I discussed patient w/ Dr. Reather Converse who supervised care of this patient.  Final Clinical Impressions(s) / ED Diagnoses   Final diagnoses:  Laceration of right index finger, foreign body presence unspecified, nail damage status unspecified, initial encounter    New Prescriptions New Prescriptions   CEPHALEXIN (KEFLEX) 500 MG CAPSULE    Take 1 capsule (500 mg total) by mouth 2 (two) times daily.     Lennice Sites, DO 08/30/16 Cecille Amsterdam    Elnora Morrison, MD 09/01/16 [phone]

## 2016-08-30 NOTE — ED Notes (Signed)
ED Provider at bedside. 

## 2016-08-30 NOTE — Discharge Instructions (Signed)
Keep wound clean and dry for 24 hours. Look for signs of infection, return if symptoms worsen.

## 2016-08-30 NOTE — ED Triage Notes (Addendum)
Pt from home with right index finger laceration. 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 [facility] here. VSS. Bandage in place, bleeding controlled.

## 2016-08-30 NOTE — ED Notes (Addendum)
Pt's hand soaking in betadine and sterile water. Suture cart at bedside.

## 2016-08-30 NOTE — ED Notes (Signed)
Pt verbalized understanding of wound/suture care and given supplies to go home to care for wound/sutures. Pt to follow up with pcp for suture removal in 10 days. VSS. No questions.

## 2016-08-30 NOTE — ED Notes (Signed)
Pt taken to xray 

## 2016-09-01 ENCOUNTER — Ambulatory Visit (INDEPENDENT_AMBULATORY_CARE_PROVIDER_SITE_OTHER): Payer: BLUE CROSS/BLUE SHIELD | Admitting: Family Medicine

## 2016-09-01 ENCOUNTER — Encounter: Payer: Self-pay | Admitting: Family Medicine

## 2016-09-01 ENCOUNTER — Other Ambulatory Visit: Payer: Self-pay | Admitting: Cardiology

## 2016-09-01 VITALS — BP 120/80 | HR 53 | Temp 97.5°F | Resp 18 | Wt 176.0 lb

## 2016-09-01 DIAGNOSIS — S61218D Laceration without foreign body of other finger without damage to nail, subsequent encounter: Secondary | ICD-10-CM | POA: Diagnosis not present

## 2016-09-01 DIAGNOSIS — E119 Type 2 diabetes mellitus without complications: Secondary | ICD-10-CM | POA: Diagnosis not present

## 2016-09-01 DIAGNOSIS — F339 Major depressive disorder, recurrent, unspecified: Secondary | ICD-10-CM

## 2016-09-01 DIAGNOSIS — S61219A Laceration without foreign body of unspecified finger without damage to nail, initial encounter: Secondary | ICD-10-CM | POA: Insufficient documentation

## 2016-09-01 DIAGNOSIS — E785 Hyperlipidemia, unspecified: Secondary | ICD-10-CM

## 2016-09-01 DIAGNOSIS — E118 Type 2 diabetes mellitus with unspecified complications: Secondary | ICD-10-CM

## 2016-09-01 LAB — COMPREHENSIVE METABOLIC PANEL
ALBUMIN: 4.3 g/dL (ref 3.5–5.2)
ALT: 9 U/L (ref 0–53)
AST: 13 U/L (ref 0–37)
Alkaline Phosphatase: 47 U/L (ref 39–117)
BUN: 8 mg/dL (ref 6–23)
CHLORIDE: 105 meq/L (ref 96–112)
CO2: 30 mEq/L (ref 19–32)
CREATININE: 0.83 mg/dL (ref 0.40–1.50)
Calcium: 9.6 mg/dL (ref 8.4–10.5)
GFR: 99.03 mL/min (ref >60.00)
Glucose, Bld: 122 mg/dL — ABNORMAL HIGH (ref 70–99)
POTASSIUM: 4.6 meq/L (ref 3.5–5.1)
Sodium: 140 mEq/L (ref 135–145)
Total Bilirubin: 0.5 mg/dL (ref 0.2–1.2)
Total Protein: 7 g/dL (ref 6.0–8.3)

## 2016-09-01 LAB — MICROALBUMIN / CREATININE URINE RATIO
Creatinine,U: 17.1 mg/dL
MICROALB UR: 0.5 mg/dL (ref 0.0–1.9)
Microalb Creat Ratio: 2.9 mg/g (ref 0.0–30.0)

## 2016-09-01 LAB — LIPID PANEL
CHOLESTEROL: 75 mg/dL (ref 0–200)
HDL: 34.3 mg/dL — AB (ref >39.00)
LDL CALC: 23 mg/dL (ref 0–99)
NonHDL: 40.91
Total CHOL/HDL Ratio: 2
Triglycerides: 90 mg/dL (ref 0.0–149.0)
VLDL: 18 mg/dL (ref 0.0–40.0)

## 2016-09-01 LAB — HEMOGLOBIN A1C: Hgb A1c MFr Bld: 7.1 % — ABNORMAL HIGH (ref 4.6–6.5)

## 2016-09-01 MED ORDER — TRAMADOL HCL 50 MG PO TABS: 50.0000 mg | ORAL_TABLET | Freq: Three times a day (TID) | ORAL | 0 refills | Status: DC | PRN

## 2016-09-01 NOTE — Progress Notes (Signed)
Pre visit review using our clinic review tool, if applicable. No additional management support is needed unless otherwise documented below in the visit note. 

## 2016-09-01 NOTE — Patient Instructions (Addendum)
Call about an eye exam when possible.   Go to the lab on the way out.  We'll contact you with your lab report. Tramadol for pain, take tylenol if needed.  Take care.  Glad to see you.  Suture removal in about 1 week.  Update me sooner if needed.

## 2016-09-01 NOTE — Assessment & Plan Note (Signed)
He does have decreased sensation to monofilament testing distally on the right second finger. Otherwise neurovascularly intact. He still has range of motion intact but this is limited by pain. The finger does not appear infected. Splinted at office visit and covered with clean nonstick bandage. That felt better for the patient. Update me as needed. Continue antibiotics. Return for suture removal. He agrees. >25 minutes spent in face to face time with patient, >50% spent in counselling or coordination of care

## 2016-09-01 NOTE — Progress Notes (Signed)
ER- recent visit for R 2nd finger injury.  Sutured.  Still painful.  No fevers, no chills. Not draining pus.  Not on meds for pain.   Diabetes:  Using medications without difficulties:yes Hypoglycemic episodes:no Hyperglycemic episodes:no Feet problems:no Blood Sugars averaging: not checked.   eye exam within last year: due, d/w pt.  See AVS.  He is planning on f/u with eye MD.   Due for labs.   He cut out sodas.   He is walking on a treadmill.    Elevated Cholesterol: Using medications without problems:yes Muscle aches: no Diet compliance:yes Exercise:yes Due for labs.  See orders.  dw pt.   Depression.  "After 11 years, it was time to come off the lexapro."  No ADE in the meantime.  Mood is good, no Si/Hi.  He was able to quit cold [place] w/o ADE.    PMH and SH reviewed  Meds, vitals, and allergies reviewed.   ROS: Per HPI unless specifically indicated in ROS section   GEN: nad, alert and oriented HEENT: mucous membranes moist NECK: supple w/o LA CV: rrr. PULM: ctab, no inc wob ABD: soft, +bs EXT: no edema  R 2nd finger NV intact (except for dec monofilament sensation distally but gross sensation intact) and it doesn't appear infected.  D/w pt.  Splinted at OV and that felt better.    Diabetic foot exam: Normal inspection No skin breakdown No calluses  Normal DP pulses Normal sensation to light touch and monofilament Nails normal except B 1st nails thickened.

## 2016-09-01 NOTE — Assessment & Plan Note (Signed)
Continue current medications. Continue work on diet and exercise. See notes on labs. 

## 2016-09-01 NOTE — Assessment & Plan Note (Signed)
Tolerating statin. Continue work on diet and exercise. See notes on labs. Continue statin.

## 2016-09-01 NOTE — Telephone Encounter (Signed)
rosuvastatin (CRESTOR) 20 MG tablet  Medication  Date: 06/19/2015 [facility] [address] Office Ordering/Authorizing: Marykay LexHarding, David W, MD  Order Providers   Prescribing Provider Encounter Provider  Marykay LexHarding, David W, MD Marykay LexHarding, David W, MD  Medication Detail    Disp Refills Start End   rosuvastatin (CRESTOR) 20 MG tablet 30 tablet 5 06/19/2015    Sig - Route: Take 1 tablet (20 mg total) by mouth daily. - Oral   Notes to Pharmacy: Per your telephone request - OK to fill generic for crestor.   E-Prescribing Status: Receipt confirmed by pharmacy (06/19/2015 11:05 AM EST)   Pharmacy   [facility] [place] - [phone]

## 2016-09-01 NOTE — Assessment & Plan Note (Signed)
He had been on treatment for years and wanted to come off SSRI. Mood is good. No suicidal or homicidal intent. Continue off medication. Update me as needed. He agrees.

## 2016-09-08 ENCOUNTER — Encounter: Payer: Self-pay | Admitting: Family Medicine

## 2016-09-08 ENCOUNTER — Ambulatory Visit (INDEPENDENT_AMBULATORY_CARE_PROVIDER_SITE_OTHER): Payer: BLUE CROSS/BLUE SHIELD | Admitting: Family Medicine

## 2016-09-08 DIAGNOSIS — S61218D Laceration without foreign body of other finger without damage to nail, subsequent encounter: Secondary | ICD-10-CM | POA: Diagnosis not present

## 2016-09-08 NOTE — Assessment & Plan Note (Signed)
Covered with bandage, he'll splint for a few more days. The macerated tissue will likely slough off but should heal underneath in the meantime.  Update me as needed.  Routine cautions given to patient.  He agrees.  Update me as needed.

## 2016-09-08 NOTE — Progress Notes (Signed)
Pre visit review using our clinic review tool, if applicable. No additional management support is needed unless otherwise documented below in the visit note. 

## 2016-09-08 NOTE — Progress Notes (Signed)
Finishing abx today.  ROM is better in the R 2nd finger.  No drainage.  Pain is better.  Splinting helped prev.  Here for suture removal.  No complaints o/w.  Distal sensation is better in the finger now.    Meds, vitals, and allergies reviewed.   ROS: Per HPI unless specifically indicated in ROS section   nad R hand with ROM apparently intact, but I didn't push him to full flexion as I didn't want the tissue to loosen after suture removal.   He has intact tissue noted with good apposition, with some macerated superficial skin.  No discharge.   Monofilament testing intact on the finger today.   All sutures removed easily w/o complication.

## 2016-09-08 NOTE — Patient Instructions (Signed)
I would use the splint for a few more days.  Try to keep it clean and covered.   You'll likely lose the whitish skin but it should heal underneath in the meantime.  Take care.  Glad to see you.  Update me as needed.

## 2016-12-18 ENCOUNTER — Other Ambulatory Visit: Payer: Self-pay | Admitting: Family Medicine

## 2017-04-02 ENCOUNTER — Encounter: Payer: Self-pay | Admitting: Family Medicine

## 2017-04-02 ENCOUNTER — Other Ambulatory Visit: Payer: Self-pay | Admitting: Family Medicine

## 2017-04-02 ENCOUNTER — Ambulatory Visit: Payer: Medicare HMO | Admitting: Family Medicine

## 2017-04-02 ENCOUNTER — Ambulatory Visit (INDEPENDENT_AMBULATORY_CARE_PROVIDER_SITE_OTHER)
Admission: RE | Admit: 2017-04-02 | Discharge: 2017-04-02 | Disposition: A | Discharge status: Home / Self Care | Payer: Medicare HMO | Source: Ambulatory Visit | Attending: Family Medicine | Admitting: Family Medicine

## 2017-04-02 VITALS — BP 122/78 | HR 67 | Temp 97.9°F | Wt 165.2 lb

## 2017-04-02 DIAGNOSIS — M25552 Pain in left hip: Secondary | ICD-10-CM

## 2017-04-02 DIAGNOSIS — E118 Type 2 diabetes mellitus with unspecified complications: Secondary | ICD-10-CM | POA: Diagnosis not present

## 2017-04-02 LAB — HEMOGLOBIN A1C: HEMOGLOBIN A1C: 6.8 % — AB (ref 4.6–6.5)

## 2017-04-02 MED ORDER — NITROGLYCERIN 0.4 MG SL SUBL: 0.4000 mg | SUBLINGUAL_TABLET | SUBLINGUAL | 12 refills | Status: DC | PRN

## 2017-04-02 MED ORDER — LISINOPRIL 2.5 MG PO TABS: 2.5000 mg | ORAL_TABLET | Freq: Every day | ORAL | Status: DC

## 2017-04-02 NOTE — Patient Instructions (Signed)
Go to the lab on the way out.  We'll contact you with your lab and xray report. Try ice on the hip 5 min on/off with fabric barrier and see if that helps.  Gently stretching may help.   Take care.  Glad to see you.

## 2017-04-02 NOTE — Progress Notes (Signed)
Pain started about 8 days ago.  Had trouble getting in/out of the bed.  Sig pain over the last week but some better now.  L hip.  No trauma.  No trigger, no known cause.  No bruising, no redness.  Lateral L troch area pain and some radiation to the groin.  Using hot packs in the meantime and that helps some.  No sx on the R side.  Pain laying on L side at night.  Pain with playing golf.    DM.  Due for labs. Not checking sugar.  Compliant with meds.  He has limited carbs.    No CP.  Flu shot done at pharmacy prev.    Meds, vitals, and allergies reviewed.   ROS: Per HPI unless specifically indicated in ROS section   nad ncat rrr ctab abd soft Able to bear weight, normal L hip ROM L greater trochanteric area puffy and slightly ttp w/o bruising Lower back not ttp

## 2017-04-02 NOTE — Telephone Encounter (Signed)
Electronic refill request. Nitroglycerin Last office visit:   May 2018 acute Last Filled:     25 tablet 2 01/21/2016  Please advise.

## 2017-04-03 DIAGNOSIS — M25552 Pain in left hip: Secondary | ICD-10-CM | POA: Insufficient documentation

## 2017-04-03 NOTE — Assessment & Plan Note (Signed)
Likely trochanteric bursitis, possible L lTB irritation. See notes on xray.  Try ice on the hip 5 min on/off with fabric barrier and see if that helps.  Gently stretching may help.  Update me as needed.

## 2017-04-03 NOTE — Assessment & Plan Note (Signed)
Compliant with metformin.  Recheck A1c today, see notes on labs.  He agrees.

## 2017-05-06 ENCOUNTER — Other Ambulatory Visit: Payer: Self-pay | Admitting: Family Medicine

## 2017-05-08 ENCOUNTER — Telehealth: Payer: Self-pay | Admitting: Family Medicine

## 2017-05-08 DIAGNOSIS — M25559 Pain in unspecified hip: Secondary | ICD-10-CM

## 2017-05-08 NOTE — Telephone Encounter (Signed)
Ordered. Thanks

## 2017-05-08 NOTE — Telephone Encounter (Signed)
rdeCopied from CRM [phone]#[id number]. Topic: Referral - Request >> May 07, 2017 10:51 AM Diana EvesHoyt, Maryann B wrote: Reason for CRM: pt wanting a referral to Dr. Despina HickAlusio for hip pain

## 2017-05-08 NOTE — Telephone Encounter (Signed)
LMOM FOR PATIENT TO RETURN MY CALL

## 2017-05-28 DIAGNOSIS — M7062 Trochanteric bursitis, left hip: Secondary | ICD-10-CM | POA: Diagnosis not present

## 2017-06-01 ENCOUNTER — Encounter: Payer: Self-pay | Admitting: Cardiology

## 2017-06-01 ENCOUNTER — Ambulatory Visit (INDEPENDENT_AMBULATORY_CARE_PROVIDER_SITE_OTHER): Payer: Medicare HMO | Admitting: Cardiology

## 2017-06-01 VITALS — BP 135/69 | HR 49 | Ht 73.0 in | Wt 168.8 lb

## 2017-06-01 DIAGNOSIS — Z955 Presence of coronary angioplasty implant and graft: Secondary | ICD-10-CM

## 2017-06-01 DIAGNOSIS — I200 Unstable angina: Secondary | ICD-10-CM | POA: Diagnosis not present

## 2017-06-01 DIAGNOSIS — Z9861 Coronary angioplasty status: Secondary | ICD-10-CM

## 2017-06-01 DIAGNOSIS — Z87891 Personal history of nicotine dependence: Secondary | ICD-10-CM

## 2017-06-01 DIAGNOSIS — E785 Hyperlipidemia, unspecified: Secondary | ICD-10-CM

## 2017-06-01 DIAGNOSIS — I2510 Atherosclerotic heart disease of native coronary artery without angina pectoris: Secondary | ICD-10-CM

## 2017-06-01 DIAGNOSIS — I10 Essential (primary) hypertension: Secondary | ICD-10-CM

## 2017-06-01 DIAGNOSIS — R0989 Other specified symptoms and signs involving the circulatory and respiratory systems: Secondary | ICD-10-CM

## 2017-06-01 DIAGNOSIS — R001 Bradycardia, unspecified: Secondary | ICD-10-CM

## 2017-06-01 MED ORDER — CLOPIDOGREL BISULFATE 75 MG PO TABS: 75.0000 mg | ORAL_TABLET | Freq: Every day | ORAL | 11 refills | Status: DC

## 2017-06-01 NOTE — Progress Notes (Signed)
PCP: Tony Ghent, MD  Clinic Note: Chief Complaint  Patient presents with  . Follow-up    Delayed annual  . Coronary Artery Disease    History of PCI  . Carotid    Right carotid disease    HPI: Tony Lynch is a 67 y.o. male with a PMH below who presents today for Annual follow-up of his CAD with history of PCI to the LAD for unstable angina in December 2015. 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  December 14 by Tony Ferries, PA --> he had an episode of chest discomfort that was prolonged. Myoview stress test was ordered, but not done --> apparently, he had the Flu-like illness.  Cancelled [facility]  [name] was last seen on July 17, 2016.  He was doing relatively well.  Once his viral illness cleared up in the December 2017, his chest discomfort episodes cleared up.  Recent Hospitalizations: n/a  Studies Reviewed: n/a  Interval History: Tony Lynch presents today feeling quite well.  He is still active with no major complaints now.  He is joining the [employer] with the [employer] program.  Plans to do workouts at least 3 days a week.  Otherwise he still golfs several days a week with quite a bit of walking. He was able to escape this winter without any current illnesses. He denies any chest tightness or pressure with rest or exertion.  No exertional dyspnea. Tony Lynch of cardiac review of symptoms: No PND, orthopnea or edema.  No palpitations, lightheadedness, dizziness, weakness or syncope/near syncope. No TIA/amaurosis fugax symptoms. No claudication.  He denies any bleeding issues with Effient.   ROS: A comprehensive was performed. Review of Systems  Constitutional: Negative for chills, fever and malaise/fatigue.  HENT: Negative for congestion and nosebleeds.   Respiratory: Negative for cough and shortness of breath.   Cardiovascular: Negative.        Per HPI  Gastrointestinal: Negative for blood in stool and  melena.  Genitourinary: Negative for hematuria.  Musculoskeletal: Negative for joint pain (L Hip bursitis; R index finger) and myalgias.  Neurological: Negative for dizziness.  Psychiatric/Behavioral: Negative for memory loss. The patient is not nervous/anxious.        No longer taking Lexapro  All other systems reviewed and are negative.   Past Medical History:  Diagnosis Date  . CAD S/P percutaneous coronary angioplasty 03/28/2014   PCI to proximal and mid LAD: mLAD - Xience Alpine DES 2.5 mm x 18 mm (3.0 mm), pLAD Xience Alpine DES 2.75 mm 18 mm (3.0 mm distal and 3.6 mm proximal.  . Depression   . Diabetes mellitus type II, controlled ([place])   . OA (osteoarthritis)    hip, back  . Tobacco abuse   . Unstable angina ([place]) 03/28/2014   EF 60-65% by LV gram.    Past Surgical History:  Procedure Laterality Date  . EXCISIONAL HEMORRHOIDECTOMY  1980's  . INGUINAL HERNIA REPAIR Right ~ 2010  . LEFT HEART CATHETERIZATION WITH CORONARY ANGIOGRAM N/A 03/28/2014   Procedure: LEFT HEART CATHETERIZATION WITH CORONARY ANGIOGRAM;  Surgeon: Leonie Man, MD;  Location: [facility] CATH LAB;  Service: CV: pLAD 95% (after SP1 & D1), mLAD 75%, Cx becomes bifurcating OM -> inferior branch 60%, downward takeoff RCA with mid 40-50%.  Marland Kitchen PERCUTANEOUS CORONARY STENT INTERVENTION (PCI-S)  03/28/2014   Procedure: PERCUTANEOUS CORONARY STENT INTERVENTION (PCI-S);  Surgeon: Leonie Man, MD;  Location: [facility]  CATH LAB;  Service: Cardiovascular;;  mLAD - Xience DES 2.5 mm x 18 mm (3.0 mm), pLAD Xience 2.75 mm x 18 mm (3.0 - 3.6 mm)   . TRANSTHORACIC ECHOCARDIOGRAM  06/2014   EF 60-65% - no RWMA.  Gr 1 DD. Mild LA & RA dilation    Current Meds  Medication Sig  . glucose monitoring kit (FREESTYLE) monitoring kit 1 each by Does not apply route as needed for other. Dispense any model that is covered- dispense testing supplies for Q AC/ HS accuchecks- 1 month supply with one refil.  Marland Kitchen lisinopril (PRINIVIL,ZESTRIL) 2.5 MG  tablet TAKE 1 TABLET BY MOUTH DAILY  . metFORMIN (GLUCOPHAGE) 500 MG tablet TAKE 1 TABLET BY MOUTH TWICE A DAY WITH A MEAL.  . nitroGLYCERIN (NITROSTAT) 0.4 MG SL tablet Place 1 tablet (0.4 mg total) under the tongue every 5 (five) minutes as needed for chest pain.  . rosuvastatin (CRESTOR) 20 MG tablet Take 1 tablet (20 mg total) by mouth daily.  . [DISCONTINUED] prasugrel (EFFIENT) 10 MG TABS tablet TAKE 1 TABLET BY MOUTH DAILY    No Known Allergies  Social History   Socioeconomic History  . Marital status: Divorced    Spouse name: None  . Number of children: None  . Years of education: None  . Highest education level: None  Social Needs  . Financial resource strain: None  . Food insecurity - worry: None  . Food insecurity - inability: None  . Transportation needs - medical: None  . Transportation needs - non-medical: None  Occupational History  . None  Tobacco Use  . Smoking status: Former Smoker    Packs/day: 1.50    Years: 46.00    Pack years: 69.00    Types: Cigarettes  . Smokeless tobacco: Never Used  Substance and Sexual Activity  . Alcohol use: No    Alcohol/week: 0.0 oz  . Drug use: No  . Sexual activity: None  Other Topics Concern  . None  Social History Narrative   Smoker 1.5 ppd x 50 years    Lives alone    1 son and 1 daughter, 2 grandsons, divorced    Works at [employer] full time    5/6 siblings alive    [employer] fan, [employer] fan    family history includes Cancer in his sister; Diabetes in his sister; Heart disease in his brother, mother, sister, sister, and sister; Stroke in his sister.  Wt Readings from Last 3 Encounters:  06/01/17 168 lb 12.8 oz (76.6 kg)  04/02/17 165 lb 4 oz (75 kg)  09/08/16 176 lb (79.8 kg)    PHYSICAL EXAM BP 135/69   Pulse (!) 49   Ht '6\' 1"'$  (1.854 m)   Wt 168 lb 12.8 oz (76.6 kg)   SpO2 98%   BMI 22.27 kg/m   Physical Exam  Constitutional: He is oriented to person, place, and time. He appears well-developed and  well-nourished. No distress.  Healthy-appearing.  Well-groomed  HENT:  Head: Normocephalic and atraumatic.  Eyes: EOM are normal.  Neck: No hepatojugular reflux and no JVD present. Carotid bruit is present (Soft right).  Cardiovascular: Normal rate, regular rhythm, S1 normal, S2 normal and normal pulses.  No extrasystoles are present. PMI is not displaced. Exam reveals no gallop and no friction rub.  Murmur heard.  Medium-pitched harsh crescendo-decrescendo early systolic murmur is present with a grade of 1/6 at the upper right sternal border. Pulmonary/Chest: Effort normal and breath sounds normal. No respiratory  distress. He has no wheezes. He has no rales.  Abdominal: Soft. Bowel sounds are normal. He exhibits no distension. There is no tenderness. There is no rebound.  Musculoskeletal: Normal range of motion. He exhibits no edema.  Neurological: He is alert and oriented to person, place, and time.  Skin: Skin is warm and dry.  Psychiatric: He has a normal mood and affect. His behavior is normal. Judgment and thought content normal.     Adult ECG Report n/a  Other studies Reviewed: Additional studies/ records that were reviewed today include:  Recent Labs:  PCP to check next week. (will as for copy)  Lab Results  Component Value Date   CHOL 75 09/01/2016   HDL 34.30 (L) 09/01/2016   LDLCALC 23 09/01/2016   TRIG 90.0 09/01/2016   CHOLHDL 2 09/01/2016   Lab Results  Component Value Date   CREATININE 0.83 09/01/2016   BUN 8 09/01/2016   NA 140 09/01/2016   K 4.6 09/01/2016   CL 105 09/01/2016   CO2 30 09/01/2016   Lab Results  Component Value Date   HGBA1C 6.8 (H) 04/02/2017    ASSESSMENT / PLAN: Problem List Items Addressed This Visit    Bradycardia (Chronic)    Resting bradycardia, not on AV nodal agent.  Would avoid AV nodal agents.  He has a history of bradycardia with ventricular bigeminy -currently not an issue. No signs or symptoms of chronotropic  incompetence.      Bruit of right carotid artery (Chronic)    He had evidence of near occlusion of the right carotid artery.  Was supposed to have been forwarded to see Dr. Gwenlyn Found, but did not.  We will recheck Dopplers now to reassess left carotid artery as well.      Relevant Orders   VAS US CAROTID   CAD S/P 2 site PCI to proximal and mid LAD with a Xience DES stents - Primary (Chronic)    Single-vessel CAD diagnosed back in December 2015 with 2 overlapping stents in the LAD.  No anginal symptoms. Plan:  Convert from Effient to Plavix.  No aspirin  Refill as needed nitroglycerin  Continue rosuvastatin and lisinopril  Wean off beta-blocker due to bradycardia.      Essential hypertension (Chronic)    Fairly well controlled on lisinopril and beta-blocker.       Former heavy tobacco smoker; quit December 2015 (Chronic)    Still doing well.  No further smoking      Hyperlipidemia with target LDL less than 70 (Chronic)    Most recent lipid panel looks pretty well controlled on current dose of rosuvastatin.  No change.  Continue diet and exercise.      Presence of drug coated stent in LAD coronary artery: Xience Alpine DES 2.75 mm x 18 mm (prox - 3.6 mm), 2.5 mm x 18 mm (~2.9 mm) mid (Chronic)   Unstable angina ([facility]) (Chronic)    His initial symptoms are quite atypical in nature.  He noted sweating and dyspnea. No recurrent symptoms.  He did have moderate disease in the circumflex and RCA with PCI to the LAD.  No active anginal symptoms.  He is on statin with adequate control as well as now on Plavix. Refill as needed nitroglycerin.         Current medicines are reviewed at length with the patient today. (+/- concerns) n/a The following changes have been made: convert to Plavix  Patient Instructions  Medication Instructions: START Plavix 75 mg tablet  daily (when the Effient has run out)  If you need a refill on your cardiac medications before your next appointment,  please call your pharmacy.   Procedure: Your physician has requested that you have a carotid duplex. This test is an ultrasound of the carotid arteries in your neck. It looks at blood flow through these arteries that supply the brain with blood. Allow one hour for this exam. There are no restrictions or special instructions. This will take place at [address], [address].   Follow-Up: Your physician wants you to follow-up in: 12 months with Dr. Ellyn Hack. You will receive a reminder letter in the mail two months in advance. If you don't receive a letter, please call our office at [phone] to schedule this follow-up appointment.   Thank you for choosing [facility] at [facility]!!        Studies Ordered:   No orders of the defined types were placed in this encounter.     Glenetta Hew, M.D., M.S. Interventional Cardiologist   Pager # [phone] Phone # [phone] [address] [place], [place] [address]

## 2017-06-01 NOTE — Patient Instructions (Addendum)
Medication Instructions: START Plavix 75 mg tablet daily (when the Effient has run out)  If you need a refill on your cardiac medications before your next appointment, please call your pharmacy.   Procedure: Your physician has requested that you have a carotid duplex. This test is an ultrasound of the carotid arteries in your neck. It looks at blood flow through these arteries that supply the brain with blood. Allow one hour for this exam. There are no restrictions or special instructions. This will take place at [address]   Follow-Up: Your physician wants you to follow-up in: 12 months with Dr. Herbie BaltimoreHarding. You will receive a reminder letter in the mail two months in advance. If you don't receive a letter, please call our office at [phone] to schedule this follow-up appointment.   Thank you for choosing [facility] at [facility]!!

## 2017-06-02 ENCOUNTER — Encounter: Payer: Self-pay | Admitting: Cardiology

## 2017-06-02 NOTE — Assessment & Plan Note (Signed)
Most recent lipid panel looks pretty well controlled on current dose of rosuvastatin.  No change.  Continue diet and exercise.

## 2017-06-02 NOTE — Assessment & Plan Note (Addendum)
He had evidence of near occlusion of the right carotid artery.  Was supposed to have been forwarded to see Dr. Allyson SabalBerry, but did not.  We will recheck Dopplers now to reassess left carotid artery as well.

## 2017-06-02 NOTE — Assessment & Plan Note (Signed)
Still doing well.  No further smoking

## 2017-06-02 NOTE — Assessment & Plan Note (Signed)
Resting bradycardia, not on AV nodal agent.  Would avoid AV nodal agents.  He has a history of bradycardia with ventricular bigeminy -currently not an issue. No signs or symptoms of chronotropic incompetence.

## 2017-06-02 NOTE — Assessment & Plan Note (Signed)
His initial symptoms are quite atypical in nature.  He noted sweating and dyspnea. No recurrent symptoms.  He did have moderate disease in the circumflex and RCA with PCI to the LAD.  No active anginal symptoms.  He is on statin with adequate control as well as now on Plavix. Refill as needed nitroglycerin.

## 2017-06-02 NOTE — Assessment & Plan Note (Signed)
Single-vessel CAD diagnosed back in December 2015 with 2 overlapping stents in the LAD.  No anginal symptoms. Plan:  Convert from Effient to Plavix.  No aspirin  Refill as needed nitroglycerin  Continue rosuvastatin and lisinopril  Wean off beta-blocker due to bradycardia.

## 2017-06-02 NOTE — Assessment & Plan Note (Signed)
Fairly well controlled on lisinopril and beta-blocker.

## 2017-06-10 ENCOUNTER — Other Ambulatory Visit: Payer: Self-pay | Admitting: Family Medicine

## 2017-06-10 MED ORDER — METFORMIN HCL 500 MG PO TABS: ORAL_TABLET | ORAL | 2 refills | Status: DC

## 2017-06-10 NOTE — Telephone Encounter (Signed)
Discussed with Dr. Para Marchuncan, no problems with medication. Refilled.

## 2017-06-10 NOTE — Telephone Encounter (Signed)
Should be okay to continue by my read.  Is there a specific concern?  I thought he was on med w/o ADE.  Thanks.

## 2017-06-10 NOTE — Telephone Encounter (Signed)
Copied from CRM [id number]. Topic: Quick Communication - See Telephone Encounter >> Jun 10, 2017  9:26 AM Eston Mouldavis, Temica Righetti B wrote: CRM for notification. See Telephone encounter for:  Tony Lynch from [facility] called requesting a verbal order for metformin.  She can be reached at [phone] 06/10/17.

## 2017-06-10 NOTE — Telephone Encounter (Signed)
Electronic refill request Last office visit 04/02/17 See office notes on 04/02/17 regarding Metformin Okay to refill?

## 2017-06-12 ENCOUNTER — Ambulatory Visit (HOSPITAL_COMMUNITY)
Admission: RE | Admit: 2017-06-12 | Discharge: 2017-06-12 | Disposition: A | Discharge status: Home / Self Care | Payer: Medicare HMO | Source: Ambulatory Visit | Attending: Cardiology | Admitting: Cardiology

## 2017-06-12 DIAGNOSIS — I6523 Occlusion and stenosis of bilateral carotid arteries: Secondary | ICD-10-CM | POA: Diagnosis not present

## 2017-06-12 DIAGNOSIS — R0989 Other specified symptoms and signs involving the circulatory and respiratory systems: Secondary | ICD-10-CM

## 2017-09-02 ENCOUNTER — Other Ambulatory Visit: Payer: Self-pay | Admitting: Family Medicine

## 2017-09-03 ENCOUNTER — Ambulatory Visit: Payer: Medicare HMO | Admitting: Family Medicine

## 2017-09-16 ENCOUNTER — Ambulatory Visit: Payer: Medicare HMO | Admitting: Family Medicine

## 2017-09-30 ENCOUNTER — Other Ambulatory Visit: Payer: Self-pay | Admitting: Cardiology

## 2017-09-30 ENCOUNTER — Other Ambulatory Visit: Payer: Self-pay | Admitting: Family Medicine

## 2017-09-30 NOTE — Telephone Encounter (Signed)
Please review message below from patient, how would you like to proceed? Thank you Consuella LoseAnastasiya V Hopkins, RMA  Copied from CRM [id number]. Topic: Quick Communication - Office Called Patient >> Sep 30, 2017 11:24 AM Consuella LoseHopkins, Anastasiya V wrote: Reason for CRM: left message for patient to call us back, needs CPE and labs prior for Dr Harrie Jeansuncan-Anastasiya V Hopkins, RMA  >> Sep 30, 2017  1:39 PM Jolayne Hainesaylor, Brittany L wrote: Patient called back in regards to his refill on lisinopril. I advised him to set up a cpe/labs with Dr Para Marchuncan. He gave me one certain time he could come and that was only 12:30 because he said he works at [employer] and gets off at 12 and he said he would not go home and come back out. I offered him an appointment for July 25th at 3 and he refused. I told him that he would not get the refill without the appointment and he said " that's fine, I will live without it "

## 2017-09-30 NOTE — Telephone Encounter (Signed)
Left message to call us back to schedule V Lanette Ell, RMA

## 2017-09-30 NOTE — Telephone Encounter (Signed)
Rx sent to pharmacy   

## 2017-09-30 NOTE — Telephone Encounter (Signed)
Please call patient and schedule CPE and labs prior. Patient no showed for last appointment and cancelled the prior appointment.  Send back for refill once patient has scheduled.

## 2017-10-01 NOTE — Telephone Encounter (Signed)
Left message to call office.  PEC, please advise pt of Dr Lianne Bushyuncan's message below if he calls back. Thanks

## 2017-10-01 NOTE — Telephone Encounter (Signed)
I sent a 90 day supply.  If we've offered multiple times and days for CPE and he doesn't want any of them, then he can find another clinic in the meantime.  If he isn't going to f/u here, then remove me as the PCP.  Thanks.

## 2017-10-02 ENCOUNTER — Encounter: Payer: Self-pay | Admitting: *Deleted

## 2017-10-06 ENCOUNTER — Encounter: Payer: Self-pay | Admitting: *Deleted

## 2017-10-06 NOTE — Telephone Encounter (Signed)
Unable to reach patient by phone on numerous occasions.  Letter mailed.

## 2017-10-12 ENCOUNTER — Other Ambulatory Visit: Payer: Self-pay

## 2017-10-12 MED ORDER — NITROGLYCERIN 0.4 MG SL SUBL: 0.4000 mg | SUBLINGUAL_TABLET | SUBLINGUAL | 1 refills | Status: DC | PRN

## 2017-10-12 NOTE — Telephone Encounter (Signed)
Rx sent electronically.  

## 2017-10-28 DIAGNOSIS — E119 Type 2 diabetes mellitus without complications: Secondary | ICD-10-CM | POA: Diagnosis not present

## 2017-10-28 DIAGNOSIS — E785 Hyperlipidemia, unspecified: Secondary | ICD-10-CM | POA: Diagnosis not present

## 2017-10-28 DIAGNOSIS — Z7902 Long term (current) use of antithrombotics/antiplatelets: Secondary | ICD-10-CM | POA: Diagnosis not present

## 2017-10-28 DIAGNOSIS — Z833 Family history of diabetes mellitus: Secondary | ICD-10-CM | POA: Diagnosis not present

## 2017-10-28 DIAGNOSIS — I252 Old myocardial infarction: Secondary | ICD-10-CM | POA: Diagnosis not present

## 2017-10-28 DIAGNOSIS — Z7984 Long term (current) use of oral hypoglycemic drugs: Secondary | ICD-10-CM | POA: Diagnosis not present

## 2017-10-28 DIAGNOSIS — I10 Essential (primary) hypertension: Secondary | ICD-10-CM | POA: Diagnosis not present

## 2017-10-28 DIAGNOSIS — I2510 Atherosclerotic heart disease of native coronary artery without angina pectoris: Secondary | ICD-10-CM | POA: Diagnosis not present

## 2017-10-28 DIAGNOSIS — Z8249 Family history of ischemic heart disease and other diseases of the circulatory system: Secondary | ICD-10-CM | POA: Diagnosis not present

## 2017-10-28 DIAGNOSIS — Z803 Family history of malignant neoplasm of breast: Secondary | ICD-10-CM | POA: Diagnosis not present

## 2017-11-17 ENCOUNTER — Telehealth: Payer: Self-pay | Admitting: Family Medicine

## 2017-11-17 NOTE — Telephone Encounter (Signed)
Copied from CRM [id number]. Topic: Appointment Scheduling - Prior Auth Required for Appointment >> Nov 16, 2017  4:01 PM Tony Lynch, Tony Lynch wrote: No appointment has been scheduled. Patient is requesting to transfer appt from Dr. Para Marchuncan to any of the other provider taking NP appointment. Per scheduling protocol, this appointment requires a prior authorization prior to scheduling.  Route to department's PEC pool.  Dr.Duncan, Per 10/01/17 telephone encounter note do you approve patient being transferred to another provider in our clinic?

## 2017-11-17 NOTE — Telephone Encounter (Signed)
Okay with me. Others have no obligation to accept.  I'll defer to them.  If I'm not the PCP now, then please remove me as the PCP.  Thanks.

## 2017-11-17 NOTE — Telephone Encounter (Signed)
I called patient and asked him which provider he'd like to see and he chose Debbie.  Eunice BlaseDebbie can patient transfer to you?

## 2017-11-18 NOTE — Telephone Encounter (Signed)
Patient scheduled appointment with Debbie on 12/09/17.

## 2017-11-18 NOTE — Telephone Encounter (Signed)
That is fine with me. Please make sure the patient is aware that I am in the office on Mondays, Wednesdays and Fridays and if this will not work for him, will need to see someone else.

## 2017-12-06 NOTE — Telephone Encounter (Signed)
If patient shows up for OV with you, please remove me as the PCP.  Thanks.

## 2017-12-09 ENCOUNTER — Ambulatory Visit: Payer: Medicare HMO | Admitting: Family Medicine

## 2017-12-09 ENCOUNTER — Other Ambulatory Visit: Payer: Self-pay | Admitting: Family Medicine

## 2017-12-09 ENCOUNTER — Encounter: Payer: Self-pay | Admitting: Family Medicine

## 2017-12-09 VITALS — BP 120/66 | HR 78 | Temp 98.3°F | Ht 73.0 in | Wt 163.0 lb

## 2017-12-09 DIAGNOSIS — Z23 Encounter for immunization: Secondary | ICD-10-CM | POA: Diagnosis not present

## 2017-12-09 DIAGNOSIS — Z125 Encounter for screening for malignant neoplasm of prostate: Secondary | ICD-10-CM | POA: Diagnosis not present

## 2017-12-09 DIAGNOSIS — R972 Elevated prostate specific antigen [PSA]: Secondary | ICD-10-CM | POA: Diagnosis not present

## 2017-12-09 DIAGNOSIS — E118 Type 2 diabetes mellitus with unspecified complications: Secondary | ICD-10-CM | POA: Diagnosis not present

## 2017-12-09 DIAGNOSIS — Z0000 Encounter for general adult medical examination without abnormal findings: Secondary | ICD-10-CM | POA: Diagnosis not present

## 2017-12-09 DIAGNOSIS — I2510 Atherosclerotic heart disease of native coronary artery without angina pectoris: Secondary | ICD-10-CM

## 2017-12-09 DIAGNOSIS — Z9861 Coronary angioplasty status: Secondary | ICD-10-CM

## 2017-12-09 DIAGNOSIS — E785 Hyperlipidemia, unspecified: Secondary | ICD-10-CM

## 2017-12-09 MED ORDER — METFORMIN HCL 500 MG PO TABS: 500.0000 mg | ORAL_TABLET | Freq: Two times a day (BID) | ORAL | 1 refills | Status: DC

## 2017-12-09 MED ORDER — CLOPIDOGREL BISULFATE 75 MG PO TABS: 75.0000 mg | ORAL_TABLET | Freq: Every day | ORAL | 1 refills | Status: DC

## 2017-12-09 MED ORDER — ROSUVASTATIN CALCIUM 20 MG PO TABS: 20.0000 mg | ORAL_TABLET | Freq: Every day | ORAL | 1 refills | Status: DC

## 2017-12-09 MED ORDER — LISINOPRIL 2.5 MG PO TABS: 2.5000 mg | ORAL_TABLET | Freq: Every day | ORAL | 1 refills | Status: DC

## 2017-12-09 NOTE — Patient Instructions (Signed)
Good to see you today  Please stop at the lab for blood/urine work- I will notify you of results through Mychart  Please schedule a 6 month follow up visit  Keeping you healthy  Get these tests  Blood pressure- Have your blood pressure checked once a year by your healthcare provider.  Normal blood pressure is 120/80  Weight- Have your body mass index (BMI) calculated to screen for obesity.  BMI is a measure of body fat based on height and weight. You can also calculate your own BMI at [email or web address]  Cholesterol- Have your cholesterol checked every year.  Diabetes- Have your blood sugar checked regularly if you have high blood pressure, high cholesterol, have a family history of diabetes or if you are overweight.  Screening for Colon Cancer- Colonoscopy starting at age 65.  Screening may begin sooner depending on your family history and other health conditions. Follow up colonoscopy as directed by your Gastroenterologist.  Screening for Prostate Cancer- Both blood work (PSA) and a rectal exam help screen for Prostate Cancer.  Screening begins at age 940 with African-American men and at age 65 with Caucasian men.  Screening may begin sooner depending on your family history.  Take these medicines  Aspirin- One aspirin daily can help prevent Heart disease and Stroke.  Flu shot- Every fall.  Tetanus- Every 10 years.  Zostavax- Once after the age of 65 to prevent Shingles.  Pneumonia shot- Once after the age of 65; if you are younger than 5165, ask your healthcare provider if you need a Pneumonia shot.  Take these steps  Don't smoke- If you do smoke, talk to your doctor about quitting.  For tips on how to quit, go to www.smokefree.gov or call 1-800-QUIT-NOW.  Be physically active- Exercise 5 days a week for at least 30 minutes.  If you are not already physically active start slow and gradually work up to 30 minutes of moderate physical activity.  Examples of moderate activity  include walking briskly, mowing the yard, dancing, swimming, bicycling, etc.  Eat a healthy diet- Eat a variety of healthy food such as fruits, vegetables, low fat milk, low fat cheese, yogurt, lean meant, poultry, fish, beans, tofu, etc. For more information go to www.thenutritionsource.org  Drink alcohol in moderation- Limit alcohol intake to less than two drinks a day. Never drink and drive.  Dentist- Brush and floss twice daily; visit your dentist twice a year.  Depression- Your emotional health is as important as your physical health. If you're feeling down, or losing interest in things you would normally enjoy please talk to your healthcare provider.  Eye exam- Visit your eye doctor every year.  Safe sex- If you may be exposed to a sexually transmitted infection, use a condom.  Seat belts- Seat belts can save your life; always wear one.  Smoke/Carbon Monoxide detectors- These detectors need to be installed on the appropriate level of your home.  Replace batteries at least once a year.  Skin cancer- When out in the sun, cover up and use sunscreen 15 SPF or higher.  Violence- If anyone is threatening you, please tell your healthcare provider.  Living Will/ Health care power of attorney- Speak with your healthcare provider and family.

## 2017-12-09 NOTE — Progress Notes (Signed)
Subjective:    Patient ID: Tony Lynch, male    DOB: 07/01/1950, 67 y.o.   MRN: [id number]  HPI This is a 67 yo male who presents today to establish care and have CPE. Previous patient of Dr. Para Marchuncan. 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 Lives by self.  Enjoys fishing, hunting.   Last CPE- unsure PSA- unknown Colonoscopy- per patient, [facility] years [facility], does not recall his follow up interval Tdap- 08/30/16 Flu- annual Eye- several years ago, is looking for a provider covered by his insurance and will make an appointment Exercise- 3x week when not cutting grass Diet- watches carbs  Past Medical History:  Diagnosis Date  . CAD S/P percutaneous coronary angioplasty 03/28/2014   PCI to proximal and mid LAD: mLAD - Xience Alpine DES 2.5 mm x 18 mm (3.0 mm), pLAD Xience Alpine DES 2.75 mm 18 mm (3.0 mm distal and 3.6 mm proximal.  . Depression   . Diabetes mellitus type II, controlled (HCC)   . OA (osteoarthritis)    hip, back  . Tobacco abuse   . Unstable angina (HCC) 03/28/2014   EF 60-65% by LV gram.   Past Surgical History:  Procedure Laterality Date  . EXCISIONAL HEMORRHOIDECTOMY  1980's  . INGUINAL HERNIA REPAIR Right ~ 2010  . LEFT HEART CATHETERIZATION WITH CORONARY ANGIOGRAM N/A 03/28/2014   Procedure: LEFT HEART CATHETERIZATION WITH CORONARY ANGIOGRAM;  Surgeon: Marykay Lexavid W Harding, MD;  [facility];  Service: CV: pLAD 95% (after SP1 & D1), mLAD 75%, Cx becomes bifurcating OM -> inferior branch 60%, downward takeoff RCA with mid 40-50%.  Marland Kitchen. PERCUTANEOUS CORONARY STENT INTERVENTION (PCI-S)  03/28/2014   Procedure: PERCUTANEOUS CORONARY STENT INTERVENTION (PCI-S);  Surgeon: Marykay Lexavid W Harding, MD;  [facility];  Service: Cardiovascular;;  mLAD - Xience DES 2.5 mm x 18 mm (3.0 mm), pLAD Xience 2.75 mm x 18 mm (3.0 - 3.6 mm)   . TRANSTHORACIC ECHOCARDIOGRAM  06/2014   EF 60-65% - no RWMA.  Gr 1 DD. Mild LA & RA dilation   Family History  Problem  Relation Age of Onset  . Diabetes Sister   . Heart disease Sister        died of MI  . Stroke Sister        2 strokes   . Heart disease Brother        multiple cardiac surgeries  . Heart disease Sister        stent  . Cancer Sister        breast cancer  . Heart disease Mother   . Heart disease Sister   . Prostate cancer Neg Hx   . Colon cancer Neg Hx    Social History   Tobacco Use  . Smoking status: Former Smoker    Packs/day: 1.50    Years: 46.00    Pack years: 69.00    Types: Cigarettes  . Smokeless tobacco: Never Used  Substance Use Topics  . Alcohol use: No    Alcohol/week: 0.0 standard drinks  . Drug use: No     Review of Systems  Constitutional: Negative.   HENT: Negative.   Eyes: Negative.   Respiratory: Negative.   Cardiovascular: Negative.   Gastrointestinal: Negative.   Endocrine: Negative.   Genitourinary: Negative.   Musculoskeletal: Negative.   Skin: Negative.   Allergic/Immunologic: Negative.   Neurological: Negative.   Hematological: Bruises/bleeds easily.  Psychiatric/Behavioral: Negative.  Objective:   Physical Exam  Physical Exam  Constitutional: He is oriented to person, place, and time. He appears well-developed and well-nourished.  HENT:  Head: Normocephalic and atraumatic.  Right Ear: External ear normal.  Left Ear: External ear normal.  Nose: Nose normal.  Mouth/Throat: Oropharynx is clear and moist.  Eyes: Conjunctivae are normal. Pupils are equal, round, and reactive to light.  Neck: Normal range of motion. Neck supple.  Cardiovascular: Normal rate, regular rhythm, normal heart sounds and intact distal pulses.   Pulmonary/Chest: Effort normal and breath sounds normal.  Abdominal: Soft. Bowel sounds are normal. Hernia confirmed negative in the right inguinal area and confirmed negative in the left inguinal area.  Genitourinary: Testes normal and penis normal. Circumcised.  Musculoskeletal: Normal range of motion. He  exhibits no edema or tenderness.       Cervical back: Normal.       Thoracic back: Normal.       Lumbar back: Normal.  Lymphadenopathy:    He has no cervical adenopathy.       Right: No inguinal adenopathy present.       Left: No inguinal adenopathy present.  Neurological: He is alert and oriented to person, place, and time.  Skin: Skin is warm and dry.  Psychiatric: He has a normal mood and affect. His behavior is normal. Judgment normal.  Vitals reviewed.     BP 120/66 (BP Location: Right Arm, Patient Position: Sitting, Cuff Size: Normal)   Pulse 78   Temp 98.3 F (36.8 C) (Oral)   Ht 6\' 1"  (1.854 m)   Wt 163 lb (73.9 kg)   SpO2 97%   BMI 21.51 kg/m  Wt Readings from Last 3 Encounters:  12/09/17 163 lb (73.9 kg)  06/01/17 168 lb 12.8 oz (76.6 kg)  04/02/17 165 lb 4 oz (75 kg)   Diabetic foot exam: Normal inspection No skin breakdown No calluses  Normal DP pulses Normal sensation to light touch and monofilament Nails- left great toe thickened     Assessment & Plan:  1. Annual physical exam - Discussed and encouraged healthy lifestyle choices- adequate sleep, regular exercise, stress management and healthy food choices.  - encouraged him to schedule eye exam ASAP  2. Type 2 diabetes mellitus with complication, without long-term current use of insulin (HCC) - previously well controlled, encouraged continued healthy food choices and regular exercise - CBC with Differential - Comprehensive metabolic panel - Hemoglobin A1c - Microalbumin / creatinine urine ratio - Lipid Panel - Vitamin B12 - lisinopril (PRINIVIL,ZESTRIL) 2.5 MG tablet; Take 1 tablet (2.5 mg total) by mouth daily.  Dispense: 90 tablet; Refill: 1 - metFORMIN (GLUCOPHAGE) 500 MG tablet; Take 1 tablet (500 mg total) by mouth 2 (two) times daily with a meal.  Dispense: 180 tablet; Refill: 1  3. Hyperlipidemia with target LDL less than 70 - CBC with Differential - Comprehensive metabolic panel - Lipid  Panel - rosuvastatin (CRESTOR) 20 MG tablet; Take 1 tablet (20 mg total) by mouth daily.  Dispense: 90 tablet; Refill: 1  4. CAD S/P 2 site PCI to proximal and mid LAD with a Xience DES stents - clopidogrel (PLAVIX) 75 MG tablet; Take 1 tablet (75 mg total) by mouth daily.  Dispense: 90 tablet; Refill: 1  5. Need for pneumococcal vaccination - Pneumococcal conjugate vaccine 13-valent  6. Screening for prostate cancer - PSA, Medicare  - follow up in 6 months Olean Ree, FNP-BC  [facility], [facility]  12/09/2017 3:04 PM

## 2017-12-09 NOTE — Telephone Encounter (Signed)
Done

## 2017-12-10 ENCOUNTER — Other Ambulatory Visit (INDEPENDENT_AMBULATORY_CARE_PROVIDER_SITE_OTHER): Payer: Medicare HMO

## 2017-12-10 DIAGNOSIS — Z125 Encounter for screening for malignant neoplasm of prostate: Secondary | ICD-10-CM

## 2017-12-10 DIAGNOSIS — E118 Type 2 diabetes mellitus with unspecified complications: Secondary | ICD-10-CM

## 2017-12-10 LAB — LIPID PANEL
Cholesterol: 142 mg/dL (ref 0–200)
HDL: 38.6 mg/dL — ABNORMAL LOW (ref >39.00)
LDL CALC: 76 mg/dL (ref 0–99)
NonHDL: 103.43
Total CHOL/HDL Ratio: 4
Triglycerides: 138 mg/dL (ref 0.0–149.0)
VLDL: 27.6 mg/dL (ref 0.0–40.0)

## 2017-12-10 LAB — COMPREHENSIVE METABOLIC PANEL
ALT: 7 U/L (ref 0–53)
AST: 13 U/L (ref 0–37)
Albumin: 4.2 g/dL (ref 3.5–5.2)
Alkaline Phosphatase: 49 U/L (ref 39–117)
BUN: 10 mg/dL (ref 6–23)
CO2: 28 mEq/L (ref 19–32)
CREATININE: 0.99 mg/dL (ref 0.40–1.50)
Calcium: 9.6 mg/dL (ref 8.4–10.5)
Chloride: 105 mEq/L (ref 96–112)
GFR: 80.48 mL/min (ref >60.00)
GLUCOSE: 126 mg/dL — AB (ref 70–99)
Potassium: 4.3 mEq/L (ref 3.5–5.1)
Sodium: 140 mEq/L (ref 135–145)
Total Bilirubin: 0.5 mg/dL (ref 0.2–1.2)
Total Protein: 6.7 g/dL (ref 6.0–8.3)

## 2017-12-10 LAB — CBC WITH DIFFERENTIAL/PLATELET
BASOS ABS: 0.1 10*3/uL (ref 0.0–0.1)
Basophils Relative: 0.8 % (ref 0.0–3.0)
EOS ABS: 0.1 10*3/uL (ref 0.0–0.7)
Eosinophils Relative: 1.1 % (ref 0.0–5.0)
HEMATOCRIT: 43.3 % (ref 39.0–52.0)
Hemoglobin: 14.7 g/dL (ref 13.0–17.0)
Lymphocytes Relative: 32 % (ref 12.0–46.0)
Lymphs Abs: 2.7 10*3/uL (ref 0.7–4.0)
MCHC: 34 g/dL (ref 30.0–36.0)
MCV: 90 fl (ref 78.0–100.0)
Monocytes Absolute: 0.6 10*3/uL (ref 0.1–1.0)
Monocytes Relative: 7.2 % (ref 3.0–12.0)
NEUTROS ABS: 5.1 10*3/uL (ref 1.4–7.7)
NEUTROS PCT: 58.9 % (ref 43.0–77.0)
PLATELETS: 355 10*3/uL (ref 150.0–400.0)
RBC: 4.81 Mil/uL (ref 4.22–5.81)
RDW: 14.3 % (ref 11.5–15.5)
WBC: 8.6 10*3/uL (ref 4.0–10.5)

## 2017-12-10 LAB — PSA, MEDICARE: PSA: 16.43 ng/mL — AB (ref 0.10–4.00)

## 2017-12-10 LAB — HEMOGLOBIN A1C: Hgb A1c MFr Bld: 6.8 % — ABNORMAL HIGH (ref 4.6–6.5)

## 2017-12-10 LAB — VITAMIN B12: Vitamin B-12: 235 pg/mL (ref 211–911)

## 2017-12-12 NOTE — Addendum Note (Signed)
Addended by: Olean ReeGESSNER, Madhuri Vacca B on: 12/12/2017 06:07 AM   Modules accepted: Orders

## 2017-12-14 NOTE — Addendum Note (Signed)
Addended by: Alvina ChouWALSH, TERRI J on: 12/14/2017 08:55 AM   Modules accepted: Orders

## 2018-01-06 ENCOUNTER — Other Ambulatory Visit: Payer: Self-pay

## 2018-01-06 ENCOUNTER — Encounter: Payer: Self-pay | Admitting: Urology

## 2018-01-06 ENCOUNTER — Ambulatory Visit: Payer: Medicare HMO | Admitting: Urology

## 2018-01-06 VITALS — BP 110/56 | HR 59 | Ht 73.0 in | Wt 163.5 lb

## 2018-01-06 DIAGNOSIS — R972 Elevated prostate specific antigen [PSA]: Secondary | ICD-10-CM | POA: Insufficient documentation

## 2018-01-06 NOTE — Progress Notes (Addendum)
01/06/2018 9:15 AM   [name] April 20, 1951 [id number]  Referring provider: Emi Belfast, FNP [address] [place], [place] [address]  CC: Elevated PSA, 16.4  HPI: I had the pleasure of seeing Mr. Kolar in urology clinic today in consultation for elevated PSA from Earl Many, NP.  He is a 67 year old male with extensive smoking history and cardiac history, with coronary stents in 2015 on Plavix, who was found to have an elevated PSA of 16.4 on 12/10/2017.  He denies any family history of prostate cancer.  There are no prior PSAs to compare to.  He denies any gross hematuria or urinary symptoms, and voids with a strong stream.  Denies erectile dysfunction.  Denies weight loss or bone pain.   PMH: Past Medical History:  Diagnosis Date  . CAD S/P percutaneous coronary angioplasty 03/28/2014   PCI to proximal and mid LAD: mLAD - Xience Alpine DES 2.5 mm x 18 mm (3.0 mm), pLAD Xience Alpine DES 2.75 mm 18 mm (3.0 mm distal and 3.6 mm proximal.  . Depression   . Diabetes mellitus type II, controlled (HCC)   . OA (osteoarthritis)    hip, back  . Tobacco abuse   . Unstable angina (HCC) 03/28/2014   EF 60-65% by LV gram.    Surgical History: Past Surgical History:  Procedure Laterality Date  . EXCISIONAL HEMORRHOIDECTOMY  1980's  . INGUINAL HERNIA REPAIR Right ~ 2010  . LEFT HEART CATHETERIZATION WITH CORONARY ANGIOGRAM N/A 03/28/2014   Procedure: LEFT HEART CATHETERIZATION WITH CORONARY ANGIOGRAM;  Surgeon: Marykay Lex, MD;  [facility] LAB;  Service: CV: pLAD 95% (after SP1 & D1), mLAD 75%, Cx becomes bifurcating OM -> inferior branch 60%, downward takeoff RCA with mid 40-50%.  Marland Kitchen PERCUTANEOUS CORONARY STENT INTERVENTION (PCI-S)  03/28/2014   Procedure: PERCUTANEOUS CORONARY STENT INTERVENTION (PCI-S);  Surgeon: Marykay Lex, MD;  Location: [facility] CATH LAB;  Service: Cardiovascular;;  mLAD - Xience DES 2.5 mm x 18 mm (3.0 mm), pLAD Xience 2.75 mm x 18 mm (3.0 -  3.6 mm)   . TRANSTHORACIC ECHOCARDIOGRAM  06/2014   EF 60-65% - no RWMA.  Gr 1 DD. Mild LA & RA dilation    Allergies: No Known Allergies  Family History: Family History  Problem Relation Age of Onset  . Diabetes Sister   . Heart disease Sister        died of MI  . Stroke Sister        2 strokes   . Heart disease Brother        multiple cardiac surgeries  . Heart disease Sister        stent  . Cancer Sister        breast cancer  . Heart disease Mother   . Heart disease Sister   . Prostate cancer Neg Hx   . Colon cancer Neg Hx     Social History:  reports that he has quit smoking. His smoking use included cigarettes. He has a 69.00 pack-year smoking history. He has never used smokeless tobacco. He reports that he does not drink alcohol or use drugs.  ROS: Please see flowsheet from today's date for complete review of systems.  Physical Exam: BP (!) 110/56   Pulse (!) 59   Ht 6\' 1"  (1.854 m)   Wt 163 lb 8 oz (74.2 kg)   BMI 21.57 kg/m    Constitutional:  Alert and oriented, No acute distress. Cardiovascular: No clubbing,  cyanosis, or edema. Respiratory: Normal respiratory effort, no increased work of breathing. GI: Abdomen is soft, nontender, nondistended, no abdominal masses GU: No CVA tenderness DRE: 65g, firm throughout, no discrete nodules Lymph: No cervical or inguinal lymphadenopathy. Skin: No rashes, bruises or suspicious lesions. Neurologic: Grossly intact, no focal deficits, moving all 4 extremities. Psychiatric: Normal mood and affect.  Laboratory Data: PSA 16.4  Pertinent Imaging: None to review  Assessment & Plan:   In summary, Mr. Ledet is a 67 year old male with extensive cardiac history, cardiac stents, on Plavix found to have an elevated PSA of 16.4.  We reviewed the implications of an elevated PSA and the uncertainty surrounding it. In general, a man's PSA increases with age and is produced by both normal and cancerous prostate tissue. The  differential diagnosis for elevated PSA includes BPH, prostate cancer, infection, recent intercourse/ejaculation, recent urethroscopic manipulation (foley placement/cystoscopy) or trauma, and prostatitis.   Management of an elevated PSA can include observation or prostate biopsy and we discussed this in detail. Our goal is to detect clinically significant prostate cancers, and manage with either active surveillance, surgery, or radiation for localized disease. Risks of prostate biopsy include bleeding, infection (including life threatening sepsis), pain, and lower urinary symptoms. Hematuria, hematospermia, and blood in the stool are all common after biopsy and can persist up to 4 weeks.   Repeat PSA today, recommend prostate biopsy if remains elevated. I will call him with results. If undergoes biopsy, will need to get clearance from cardiology to stop Plavix ~4-5 days  Sondra Come, MD  [facility] [address], [address] [place], [place] [address] [phone]

## 2018-01-07 ENCOUNTER — Telehealth: Payer: Self-pay

## 2018-01-07 DIAGNOSIS — R69 Illness, unspecified: Secondary | ICD-10-CM | POA: Diagnosis not present

## 2018-01-07 LAB — PSA TOTAL (REFLEX TO FREE): Prostate Specific Ag, Serum: 11.6 ng/mL — ABNORMAL HIGH (ref 0.0–4.0)

## 2018-01-07 NOTE — Telephone Encounter (Signed)
LMOM for patient to return call.

## 2018-01-07 NOTE — Telephone Encounter (Signed)
Patient notified, Biopsy appointment scheduled, BX instructions mailed. A Clearance letter was faxed to Cardiologist

## 2018-01-07 NOTE — Telephone Encounter (Signed)
-----   Message from Sondra ComeBrian C Sninsky, MD sent at 01/07/2018  9:28 AM EDT ----- Regarding: schedule prostate biopsy I called [name] twice to discuss his persistently elevated PSA, no answer.  Please contact him and let him know his PSA remains significantly elevated at 11.6. We strongly recommend prostate biopsy. He needs cardiology clearance to stop his plavix at least 5 days.   Please schedule prostate biopsy, and arrange cardiology appt to get clearance to stop anticoagulation with his extensive cardiac history.  Thanks Sondra ComeBrian C Sninsky, MD 01/07/2018    ----- Message ----- From: Interface, [facility] Lab Results In Sent: 01/07/2018   5:41 AM EDT To: Sondra ComeBrian C Sninsky, MD

## 2018-01-08 ENCOUNTER — Telehealth: Payer: Self-pay

## 2018-01-08 NOTE — Telephone Encounter (Signed)
Follow up ° ° ° °Patient is returning call in reference to pre-op clearance.  °

## 2018-01-08 NOTE — Telephone Encounter (Signed)
Called pt to discuss pre-operative clearance for prostate biopsy without response. Left call back number with phone message.   Tony ChardJill Naira Standiford NP-C [facility]

## 2018-01-08 NOTE — Telephone Encounter (Signed)
     [facility] Pre-operative Risk Assessment    Request for surgical clearance:  1. What type of surgery is being performed? PROSTATE BIOPSY   2. When is this surgery scheduled?  01-26-2018   3. What type of clearance is required (medical clearance vs. Pharmacy clearance to hold med vs. Both)?  BOTH  4. Are there any medications that need to be held prior to surgery and how long?    5. Practice name and name of physician performing surgery?   [facility]   6. What is your office phone number [phone]    7.   What is your office fax number [phone]  8.   Anesthesia type (None, local, MAC, general) ?  NOT LISTED   Tony Lynch 01/08/2018, 10:09 AM  _________________________________________________________________   (provider comments below)

## 2018-01-08 NOTE — Telephone Encounter (Addendum)
   Primary Cardiologist: Bryan Lemmaavid Harding, MD  Chart reviewed as part of pre-operative protocol coverage. Given past medical history and time since last visit, based on ACC/AHA guidelines, Tony Lynch would be at acceptable risk for the planned procedure without further cardiovascular testing.   Sent request to Dr. Herbie BaltimoreHarding for Plavix recommendations. He recommends holding Plavix 5 days prior to procedure. Will forward to requesting party.    Please call with questions.  [name], NP 01/08/2018, 2:02 PM

## 2018-01-13 NOTE — Telephone Encounter (Signed)
Cardiac clearance received, pt okay to stop plavix 5 days prior to biopsy.  I have left a detailed message for pt.

## 2018-01-26 ENCOUNTER — Encounter: Payer: Self-pay | Admitting: Urology

## 2018-01-26 ENCOUNTER — Ambulatory Visit: Payer: Medicare HMO | Admitting: Urology

## 2018-01-26 ENCOUNTER — Other Ambulatory Visit: Payer: Self-pay | Admitting: Urology

## 2018-01-26 VITALS — BP 143/79 | HR 48 | Resp 16 | Ht 73.0 in | Wt 161.2 lb

## 2018-01-26 DIAGNOSIS — R972 Elevated prostate specific antigen [PSA]: Secondary | ICD-10-CM

## 2018-01-26 MED ORDER — LEVOFLOXACIN 500 MG PO TABS: 500.0000 mg | ORAL_TABLET | Freq: Once | ORAL | Status: DC

## 2018-01-26 MED ORDER — GENTAMICIN SULFATE 40 MG/ML IJ SOLN
80.0000 mg | Freq: Once | INTRAMUSCULAR | Status: DC
Start: 2018-01-26 — End: 2018-12-01

## 2018-01-26 NOTE — Progress Notes (Signed)
   01/26/18  Indication: Elevated PSA, 16.4  Prostate Biopsy Procedure   Informed consent was obtained, and we discussed the risks of bleeding and infection/sepsis. A time out was performed to ensure correct patient identity.  Pre-Procedure: - Last PSA Level: 16.4 - Gentamicin and levaquin given for antibiotic prophylaxis - Transrectal Ultrasound performed revealing an 86 gm prostate - Moderate sized median lobe noted  Procedure: - Prostate block performed using 10 cc 1% lidocaine and biopsies taken from sextant areas, a total of 12 under ultrasound guidance.  Post-Procedure: - Patient tolerated the procedure well - He was counseled to seek immediate medical attention if experiences significant bleeding, fevers, or severe pain - Return in one week to discuss biopsy results  Assessment/ Plan: Will follow up in 1-2 weeks to discuss pathology  Legrand Rams, MD 01/26/2018

## 2018-02-01 ENCOUNTER — Other Ambulatory Visit: Payer: Self-pay | Admitting: Urology

## 2018-02-01 LAB — PATHOLOGY REPORT

## 2018-02-09 ENCOUNTER — Encounter: Payer: Self-pay | Admitting: Urology

## 2018-02-09 ENCOUNTER — Ambulatory Visit (INDEPENDENT_AMBULATORY_CARE_PROVIDER_SITE_OTHER): Payer: Medicare HMO | Admitting: Urology

## 2018-02-09 ENCOUNTER — Other Ambulatory Visit: Payer: Self-pay

## 2018-02-09 VITALS — BP 118/75 | HR 76 | Ht 73.0 in | Wt 164.9 lb

## 2018-02-09 DIAGNOSIS — R972 Elevated prostate specific antigen [PSA]: Secondary | ICD-10-CM

## 2018-02-09 NOTE — Progress Notes (Signed)
   02/09/2018 11:27 AM   Tony Lynch 1950-09-20 [id number]  Reason for visit: Discuss prostate biopsy results  [name] is a 67 year old male with cardiac history on Plavix who is here to discuss his prostate biopsy results.  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  His PSA was found to be 16.4, and on repeat remained elevated at 11.6.  He underwent a prostate biopsy on October 1 of 2019 which showed an 86g gland, and only benign prostate tissue.  He recovered well after his biopsy, and has not had any persistent issues with hematuria or blood in the stool.  On his most recent PSA of 11.6, this equates to a PSA density of 0.13, which is reassuring, especially with his history of long hours on a mower during the day.  We discussed options moving forward including repeat PSA in approximately 3-4 months versus prostate MRI.  I feel with his reassuring PSA density, negative biopsy, and the fact that he spends many hours a day on a mower, this PSA elevation is most likely benign.  He does not mow during the winter, so the PSA in February will hopefully give [place] a more accurate value.  RTC 3-4 months with PSA  Please note that15 minutes were spent with this patient, greater than 50% were spent in direct face-to-face education and counseling regarding PSA elevation and biopsy results.  [facility] [address] [place], [place] [address] [phone]

## 2018-05-10 IMAGING — DX DG HAND COMPLETE 3+V*R*
3 series · 3 of 3 positions shown · non-contrast
Comparison: None.

CLINICAL DATA: Laceration to the right index finger from lawn
mower. Initial encounter. Is

EXAM:
RIGHT HAND - COMPLETE 3+ VIEW

[hand pa]
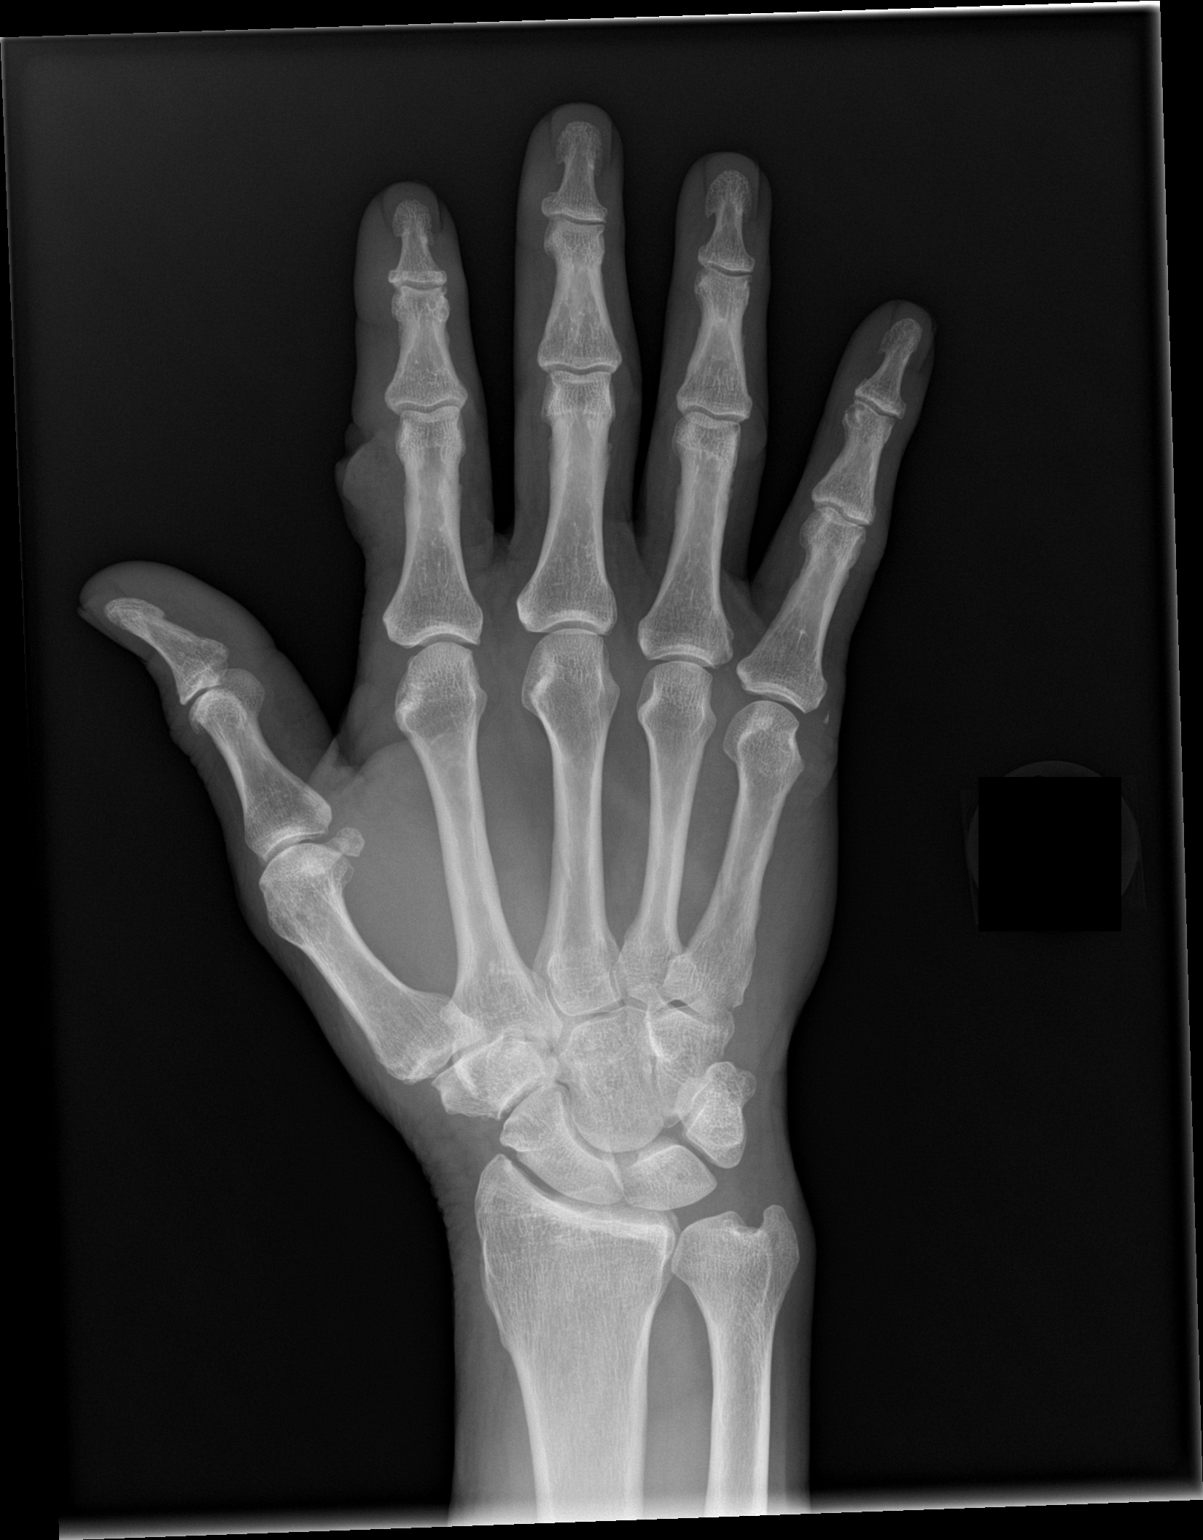

[hand obl]
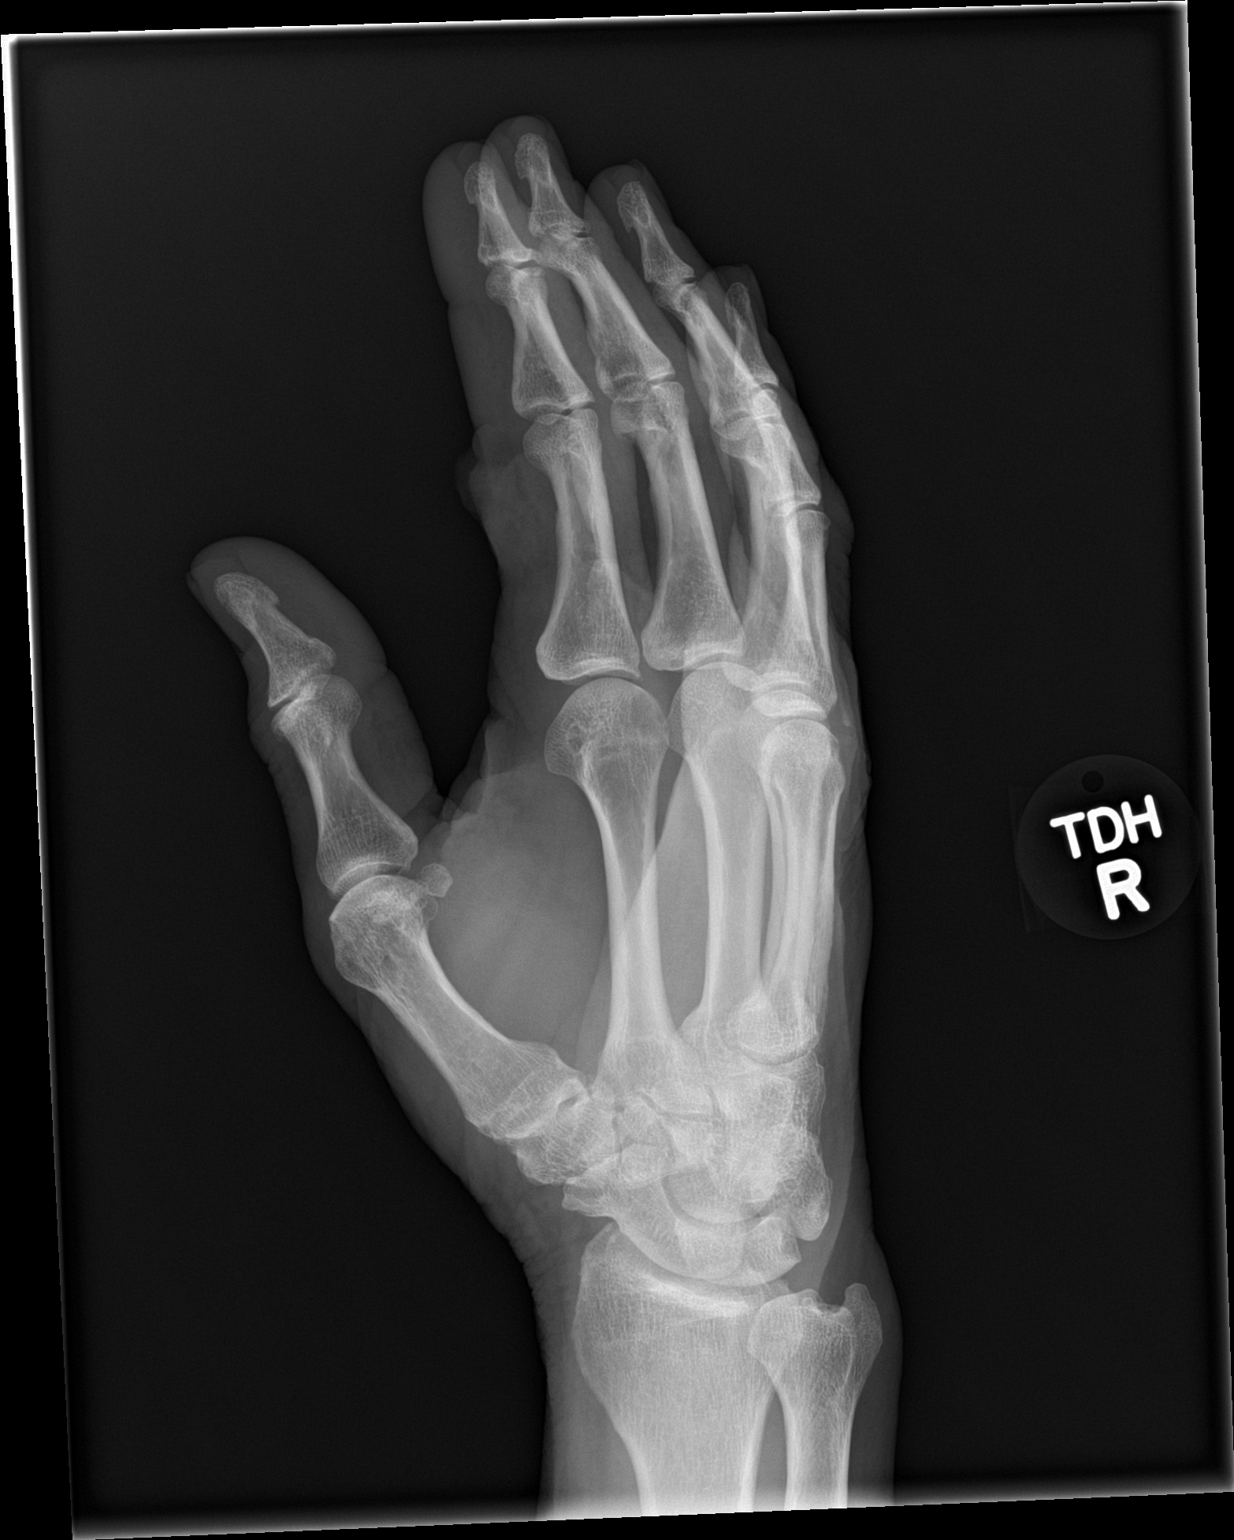

[hand lat]
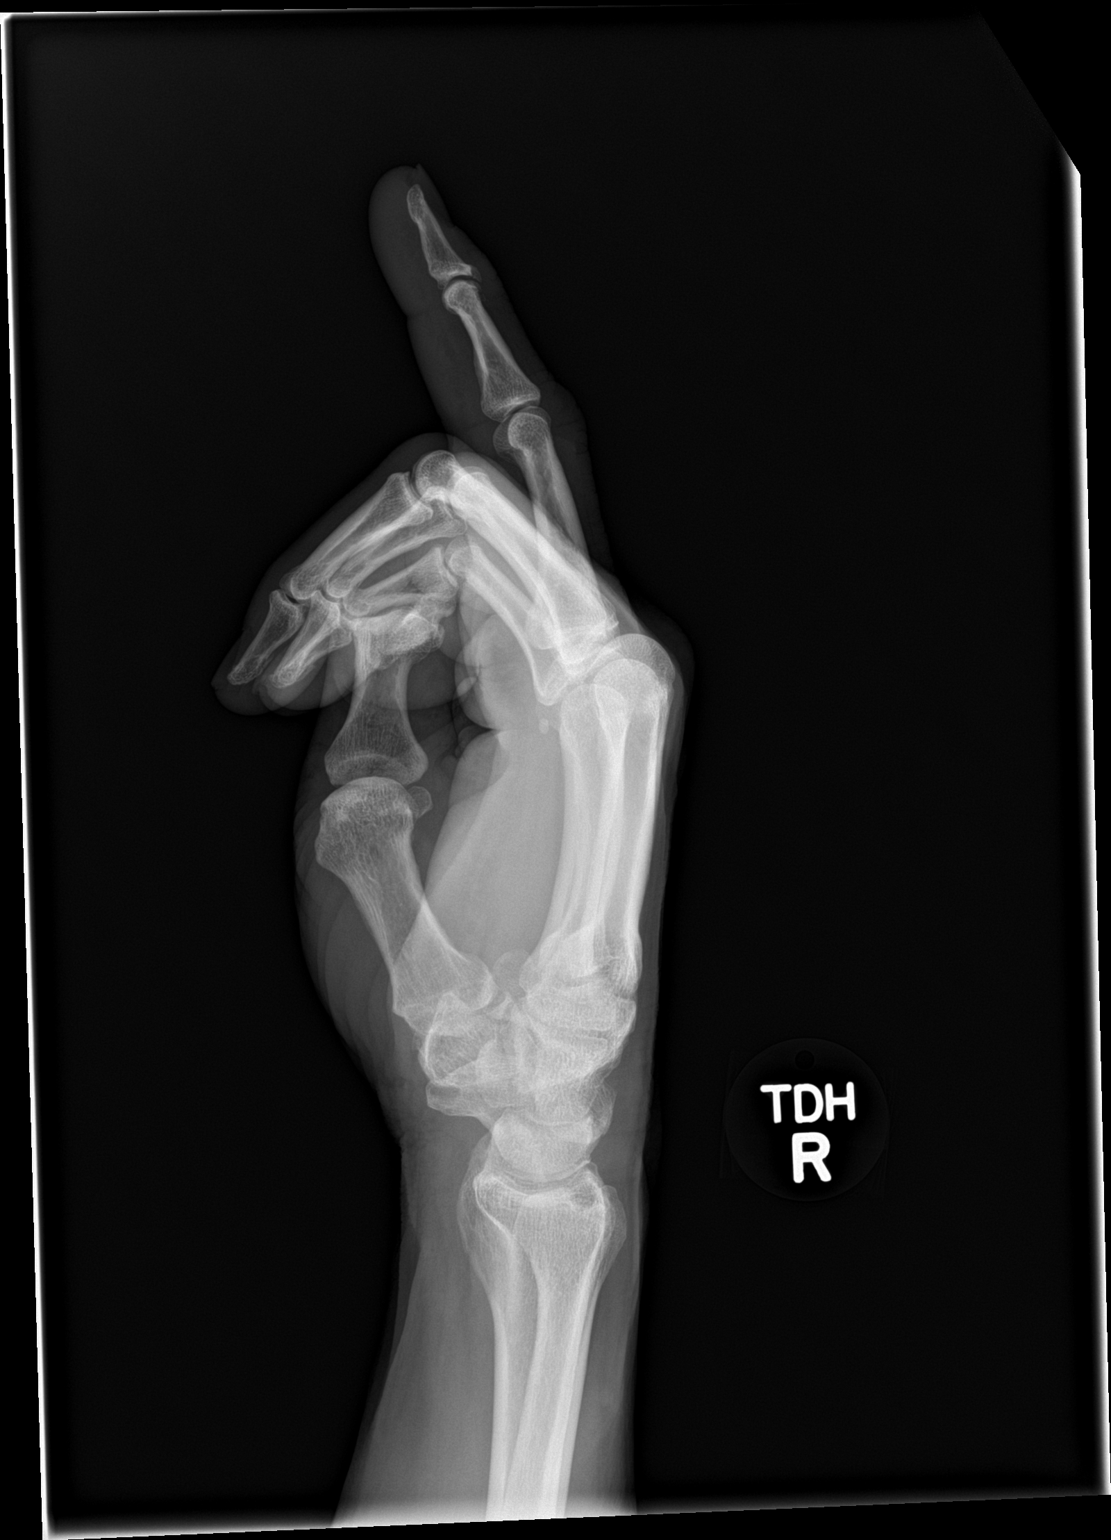

[3 of 3 positions shown; findings below may reference images not displayed]

FINDINGS: There is no evidence of fracture or dislocation. There is no
evidence of osseous disruption. The joint spaces are preserved. The
carpal rows are intact, and demonstrate normal alignment. A soft
tissue laceration is noted along the second digit, without
radiopaque foreign body. Likely minimal calcification is noted
overlying the fifth metacarpophalangeal joint.
IMPRESSION: No evidence of fracture or dislocation. No radiopaque foreign bodies
seen.

## 2018-05-15 DIAGNOSIS — E119 Type 2 diabetes mellitus without complications: Secondary | ICD-10-CM | POA: Diagnosis not present

## 2018-05-15 DIAGNOSIS — Z833 Family history of diabetes mellitus: Secondary | ICD-10-CM | POA: Diagnosis not present

## 2018-05-15 DIAGNOSIS — I25119 Atherosclerotic heart disease of native coronary artery with unspecified angina pectoris: Secondary | ICD-10-CM | POA: Diagnosis not present

## 2018-05-15 DIAGNOSIS — I252 Old myocardial infarction: Secondary | ICD-10-CM | POA: Diagnosis not present

## 2018-05-15 DIAGNOSIS — E785 Hyperlipidemia, unspecified: Secondary | ICD-10-CM | POA: Diagnosis not present

## 2018-05-15 DIAGNOSIS — Z8249 Family history of ischemic heart disease and other diseases of the circulatory system: Secondary | ICD-10-CM | POA: Diagnosis not present

## 2018-05-15 DIAGNOSIS — Z7902 Long term (current) use of antithrombotics/antiplatelets: Secondary | ICD-10-CM | POA: Diagnosis not present

## 2018-05-15 DIAGNOSIS — Z7984 Long term (current) use of oral hypoglycemic drugs: Secondary | ICD-10-CM | POA: Diagnosis not present

## 2018-05-15 DIAGNOSIS — Z803 Family history of malignant neoplasm of breast: Secondary | ICD-10-CM | POA: Diagnosis not present

## 2018-05-15 DIAGNOSIS — I10 Essential (primary) hypertension: Secondary | ICD-10-CM | POA: Diagnosis not present

## 2018-05-31 ENCOUNTER — Other Ambulatory Visit: Payer: Self-pay | Admitting: Family Medicine

## 2018-05-31 DIAGNOSIS — E118 Type 2 diabetes mellitus with unspecified complications: Secondary | ICD-10-CM

## 2018-05-31 DIAGNOSIS — E785 Hyperlipidemia, unspecified: Secondary | ICD-10-CM

## 2018-06-17 ENCOUNTER — Other Ambulatory Visit: Payer: Medicare HMO

## 2018-06-30 ENCOUNTER — Other Ambulatory Visit: Payer: Self-pay | Admitting: Family Medicine

## 2018-06-30 DIAGNOSIS — Z9861 Coronary angioplasty status: Principal | ICD-10-CM

## 2018-06-30 DIAGNOSIS — I2510 Atherosclerotic heart disease of native coronary artery without angina pectoris: Secondary | ICD-10-CM

## 2018-06-30 NOTE — Telephone Encounter (Signed)
Electronic refill request Plavix Last office visit 12/09/17 Last refill 12/09/17 #90/1

## 2018-07-02 NOTE — Telephone Encounter (Signed)
Please call patient and schedule him a follow up appointment.

## 2018-08-30 ENCOUNTER — Other Ambulatory Visit (HOSPITAL_COMMUNITY): Payer: Self-pay | Admitting: Cardiology

## 2018-08-30 DIAGNOSIS — I6521 Occlusion and stenosis of right carotid artery: Secondary | ICD-10-CM

## 2018-09-16 ENCOUNTER — Telehealth: Payer: Self-pay | Admitting: Cardiology

## 2018-09-16 NOTE — Telephone Encounter (Signed)
smartphone/ consent/my chart/ pre reg competed

## 2018-09-21 ENCOUNTER — Telehealth: Payer: Self-pay | Admitting: *Deleted

## 2018-09-21 ENCOUNTER — Encounter: Payer: Self-pay | Admitting: Cardiology

## 2018-09-21 ENCOUNTER — Telehealth (INDEPENDENT_AMBULATORY_CARE_PROVIDER_SITE_OTHER): Payer: Medicare HMO | Admitting: Cardiology

## 2018-09-21 DIAGNOSIS — I2510 Atherosclerotic heart disease of native coronary artery without angina pectoris: Secondary | ICD-10-CM | POA: Diagnosis not present

## 2018-09-21 DIAGNOSIS — Z87891 Personal history of nicotine dependence: Secondary | ICD-10-CM | POA: Diagnosis not present

## 2018-09-21 DIAGNOSIS — Z955 Presence of coronary angioplasty implant and graft: Secondary | ICD-10-CM

## 2018-09-21 DIAGNOSIS — R001 Bradycardia, unspecified: Secondary | ICD-10-CM

## 2018-09-21 DIAGNOSIS — I6521 Occlusion and stenosis of right carotid artery: Secondary | ICD-10-CM

## 2018-09-21 DIAGNOSIS — I10 Essential (primary) hypertension: Secondary | ICD-10-CM | POA: Diagnosis not present

## 2018-09-21 DIAGNOSIS — E785 Hyperlipidemia, unspecified: Secondary | ICD-10-CM

## 2018-09-21 NOTE — Assessment & Plan Note (Signed)
2 overlapping DES to the LAD.  Now on Plavix without aspirin.  Okay to hold Plavix if necessary for procedures.

## 2018-09-21 NOTE — Telephone Encounter (Signed)
SPOKE TO PATIENT -INSTRUCTIONS GIVEN FROM TELE-VISIT 5/26 . MYCHART WILL BE E-SENT VIA MYCHART.PATIENT VERBALIZED UNDERSTANDING

## 2018-09-21 NOTE — Assessment & Plan Note (Signed)
History of sinus bradycardia with ventricular bigeminy.  Beta-blocker stopped and currently trying to avoid AV nodal agents.

## 2018-09-21 NOTE — Assessment & Plan Note (Signed)
Blood pressures been pretty well controlled on lisinopril.  Unfortunate did not have vitals to evaluate at this point time we will defer to PCP to potentially titrate up ACE inhibitor if necessary, but for now will maintain current dosing.

## 2018-09-21 NOTE — Assessment & Plan Note (Addendum)
Doing well with smoking consider cessation. Probably has some exertional dyspnea from mild COPD, but not "diagnosed".

## 2018-09-21 NOTE — Assessment & Plan Note (Signed)
Single-vessel disease dating back to 2015 with 2 overlapping stents in the LAD.  Now stable on Plavix without aspirin.  Not on beta-blocker because of bradycardia issues.  Continue statin, ACE inhibitor and Plavix.

## 2018-09-21 NOTE — Assessment & Plan Note (Signed)
Is due for follow-up labs in August.  Lipids in 2019 looked pretty well controlled on current dose of statin.  Continue to encourage diet and exercise adjustment.

## 2018-09-21 NOTE — Patient Instructions (Addendum)
Medication Instructions:  No  Changes   If you need a refill on your cardiac medications before your next appointment, please call your pharmacy.   Lab work: PCP should be checking Cholesterol levels in ~ August   Testing/Procedures: -- continue with planned Carotid Dopplers this week ,  as scheduled  Follow-Up: At [facility], you and your health needs are our priority.  As part of our continuing mission to provide you with exceptional heart care, we have created designated Provider Care Teams.  These Care Teams include your primary Cardiologist (physician) and Advanced Practice Providers (APPs -  Physician Assistants and Nurse Practitioners) who all work together to provide you with the care you need, when you need it. . You will need a follow up appointment in  6-7 months-NOV-DEC 2020.  Please call our office 2 months in advance to schedule this appointment.  You may see Bryan Lemma, MD or one of the following Advanced Practice Providers on your designated Care Team:   . Theodore Demark, PA-C . Joni Reining, DNP, ANP  Any Other Special Instructions Will Be Listed Below (If Applicable).

## 2018-09-21 NOTE — Progress Notes (Signed)
Virtual Visit via Video Note   This visit type was conducted due to national recommendations for restrictions regarding the COVID-19 Pandemic (e.g. social distancing) in an effort to limit this patient's exposure and mitigate transmission in our community.  Due to his co-morbid illnesses, this patient is at least at moderate risk for complications without adequate follow up.  This format is felt to be most appropriate for this patient at this time.  All issues noted in this document were discussed and addressed.  A limited physical exam was performed with this format.  Please refer to the patient's chart for his consent to telehealth for [facility].   Initial attempts at video call -unsuccessful; audio muted.   Patient has given verbal permission to conduct this visit via virtual appointment and to bill insurance 09/21/2018 1:12 PM     Evaluation Performed:  Follow-up visit  Date:  09/21/2018   ID:  Tony Lynch, DOB 1951-01-30, MRN [id number]  Patient Location: Other:  work Provider Location: Home  PCP:  Emi Belfast, FNP  Cardiologist:  Bryan Lemma, MD  Electrophysiologist:  None   Chief Complaint:  Delayed Annual F/u: CAD-PCI  History of Present Illness:    Tony Lynch is a 68 y.o. male with PMH notable for CAD-PCI for Unstable Angina (Dec 2015 - see PMH) who presents via audio/video conferencing for a telehealth visit today.  JAZPER JOLLY was last seen in February 2019.  He was doing very well.  No complaints.  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 --Weaned off beta-blocker because of chronotropic incompetence. --On ACE inhibitor, statin.  Converted to Plavix from Effient  Due for Carotid US this week.   Interval History:  Working @ [employer] - on feet all shift (except 30 min shift). Worked long shift Sunday PM-Monday AM. Picking up 50 lb bags.  No chest pain.  Feels well otherwise.    Cardiovascular ROS: no chest pain or dyspnea on exertion  negative for - irregular heartbeat, loss of consciousness, orthopnea, palpitations, paroxysmal nocturnal dyspnea, rapid heart rate, shortness of breath or TIA/amaurosis fugax   Changed diet after Dx of DM - lost ~ 20Lb in 5 yrs.  Off carbs & Ice cream.    The patient does not have symptoms concerning for COVID-19 infection (fever, chills, cough, or new shortness of breath).  The patient is practicing social distancing.  ROS:  Please see the history of present illness.    Review of Systems  Constitutional: Negative for malaise/fatigue.  HENT: Negative for nosebleeds.   Respiratory: Negative for shortness of breath.   Cardiovascular: Negative for leg swelling.  Gastrointestinal: Negative for blood in stool, diarrhea, nausea and vomiting.  Genitourinary: Negative for hematuria.  Musculoskeletal: Positive for joint pain (just mild arthritis).  Neurological: Negative for dizziness and focal weakness.  Psychiatric/Behavioral: Negative.     Past Medical History:  Diagnosis Date  . CAD S/P percutaneous coronary angioplasty 03/28/2014   PCI to proximal and mid LAD: mLAD - Xience Alpine DES 2.5 mm x 18 mm (3.0 mm), pLAD Xience Alpine DES 2.75 mm 18 mm (3.0 mm distal and 3.6 mm proximal.  . Depression   . Diabetes mellitus type II, controlled (HCC)   . OA (osteoarthritis)    hip, back  . Tobacco abuse   . Unstable angina (HCC) 03/28/2014   DES x2 - p-mLAD   Past Surgical History:  Procedure Laterality Date  . EXCISIONAL HEMORRHOIDECTOMY  1980's  . INGUINAL  HERNIA REPAIR Right ~ 2010  . LEFT HEART CATHETERIZATION WITH CORONARY ANGIOGRAM N/A 03/28/2014   Procedure: LEFT HEART CATHETERIZATION WITH CORONARY ANGIOGRAM;  Surgeon: Marykay Lex, MD;  Location[facility] C[facility] LAB;  Service: CV: pLAD 95% (after SP1 & D1), mLAD 75%, Cx becomes bifurcating OM -> inferior branch 60%, downward takeoff RCA with mid 40-50%.  Marland Kitchen PERCUTANEOUS CORONARY STENT INTERVENTION (PCI-S)  03/28/2014   Procedure:  PERCUTANEOUS CORONARY STENT INTERVENTION (PCI-S);  Surgeon: Marykay Lex, MD;  Location: [facility] CATH LAB;  Service: Cardiovascular;;  mLAD - Xience DES 2.5 mm x 18 mm (3.0 mm), pLAD Xience 2.75 mm x 18 mm (3.0 - 3.6 mm)   . TRANSTHORACIC ECHOCARDIOGRAM  06/2014   EF 60-65% - no RWMA.  Gr 1 DD. Mild LA & RA dilation     Current Meds  Medication Sig  . clopidogrel (PLAVIX) 75 MG tablet TAKE 1 TABLET BY MOUTH EVERY DAY  . lisinopril (PRINIVIL,ZESTRIL) 2.5 MG tablet TAKE 1 TABLET BY MOUTH EVERY DAY  . metFORMIN (GLUCOPHAGE) 500 MG tablet TAKE 1 TABLET BY MOUTH TWICE A DAY WITH A MEAL  . nitroGLYCERIN (NITROSTAT) 0.4 MG SL tablet Place 1 tablet (0.4 mg total) under the tongue every 5 (five) minutes as needed for chest pain.  . rosuvastatin (CRESTOR) 20 MG tablet Take 20 mg by mouth daily.   Current Facility-Administered Medications for the 09/21/18 encounter (Telemedicine) with Marykay Lex, MD  Medication  . gentamicin (GARAMYCIN) injection 80 mg  . levofloxacin (LEVAQUIN) tablet 500 mg     Allergies:   Patient has no known allergies.   Social History   Tobacco Use  . Smoking status: Former Smoker    Packs/day: 1.50    Years: 46.00    Pack years: 69.00    Types: Cigarettes  . Smokeless tobacco: Former Engineer, water Use Topics  . Alcohol use: No    Alcohol/week: 0.0 standard drinks  . Drug use: No     Family Hx: The patient's family history includes Cancer in his sister; Diabetes in his sister; Heart disease in his brother, mother, sister, sister, and sister; Stroke in his sister. There is no history of Prostate cancer or Colon cancer.   Prior CV studies:   The following studies were reviewed today: . Carotid Dopplers February 2019: Known R but ICA 100%.  Left carotid<40 %.  Vertebral subclavian arteries patent.:  Labs/Other Tests and Data Reviewed:    EKG:  No ECG reviewed.  Recent Labs: 12/10/2017: ALT 7; BUN 10; Creatinine, Ser 0.99; Hemoglobin 14.7; Platelets 355.0;  Potassium 4.3; Sodium 140   Recent Lipid Panel - followed by PCP. Lab Results  Component Value Date/Time   CHOL 142 12/10/2017 12:32 PM   TRIG 138.0 12/10/2017 12:32 PM   HDL 38.60 (L) 12/10/2017 12:32 PM   CHOLHDL 4 12/10/2017 12:32 PM   LDLCALC 76 12/10/2017 12:32 PM   No bleeding issues. Wt Readings from Last 3 Encounters:  02/09/18 164 lb 14.4 oz (74.8 kg)  01/26/18 161 lb 3.2 oz (73.1 kg)  01/06/18 163 lb 8 oz (74.2 kg)     Objective:    Vital Signs:  There were no vitals taken for this visit.  VITAL SIGNS:  reviewed RESPIRATORY:  normal respiratory effort, symmetric expansion NEURO:  alert and oriented x 3, no obvious focal deficit PSYCH:  normal affect   ASSESSMENT & PLAN:    Problem List Items Addressed This Visit    Bradycardia - Primary (Chronic)  History of sinus bradycardia with ventricular bigeminy.  Beta-blocker stopped and currently trying to avoid AV nodal agents.      CAD S/P 2 site PCI to proximal and mid LAD with a Xience DES stents (Chronic)    Single-vessel disease dating back to 2015 with 2 overlapping stents in the LAD.  Now stable on Plavix without aspirin.  Not on beta-blocker because of bradycardia issues.  Continue statin, ACE inhibitor and Plavix.      Relevant Medications   rosuvastatin (CRESTOR) 20 MG tablet   Essential hypertension (Chronic)    Blood pressures been pretty well controlled on lisinopril.  Unfortunate did not have vitals to evaluate at this point time we will defer to PCP to potentially titrate up ACE inhibitor if necessary, but for now will maintain current dosing.      Relevant Medications   rosuvastatin (CRESTOR) 20 MG tablet   Former heavy tobacco smoker; quit December 2015 (Chronic)    Doing well with smoking consider cessation. Probably has some exertional dyspnea from mild COPD, but not "diagnosed".      Hyperlipidemia with target LDL less than 70 (Chronic)    Is due for follow-up labs in August.  Lipids in  2019 looked pretty well controlled on current dose of statin.  Continue to encourage diet and exercise adjustment.      Relevant Medications   rosuvastatin (CRESTOR) 20 MG tablet   Presence of drug coated stent in LAD coronary artery: Xience Alpine DES 2.75 mm x 18 mm (prox - 3.6 mm), 2.5 mm x 18 mm (~2.9 mm) mid (Chronic)    2 overlapping DES to the LAD.  Now on Plavix without aspirin.  Okay to hold Plavix if necessary for procedures.      Right carotid artery occlusion (Chronic)    Essentially occlusion of the right coronary artery.  Follow-up annually.      Relevant Medications   rosuvastatin (CRESTOR) 20 MG tablet      COVID-19 Education: The signs and symptoms of COVID-19 were discussed with the patient and how to seek care for testing (follow up with PCP or arrange E-visit).   The importance of social distancing was discussed today.  Time:   Today, I have spent 15 minutes with the patient with telehealth technology discussing the above problems.     Medication Adjustments/Labs and Tests Ordered: Current medicines are reviewed at length with the patient today.  Concerns regarding medicines are outlined above.  Medication Instructions: No change  Tests Ordered: No orders of the defined types were placed in this encounter.   Medication Changes: No orders of the defined types were placed in this encounter.   Disposition:  Follow up in Nov-Dec 2020    Signed, Bryan Lemmaavid Alexi Geibel, MD  09/21/2018 1:12 PM    [facility]

## 2018-09-21 NOTE — Assessment & Plan Note (Signed)
Essentially occlusion of the right coronary artery.  Follow-up annually.

## 2018-09-24 ENCOUNTER — Other Ambulatory Visit: Payer: Self-pay

## 2018-09-24 ENCOUNTER — Ambulatory Visit (HOSPITAL_COMMUNITY)
Admission: RE | Admit: 2018-09-24 | Discharge: 2018-09-24 | Disposition: A | Discharge status: Home / Self Care | Payer: Medicare HMO | Source: Ambulatory Visit | Attending: Internal Medicine | Admitting: Internal Medicine

## 2018-09-24 DIAGNOSIS — I6521 Occlusion and stenosis of right carotid artery: Secondary | ICD-10-CM | POA: Diagnosis not present

## 2018-10-18 ENCOUNTER — Other Ambulatory Visit (HOSPITAL_COMMUNITY): Payer: Self-pay | Admitting: Cardiology

## 2018-10-18 DIAGNOSIS — I6523 Occlusion and stenosis of bilateral carotid arteries: Secondary | ICD-10-CM

## 2018-11-22 ENCOUNTER — Other Ambulatory Visit: Payer: Self-pay | Admitting: Family Medicine

## 2018-11-22 DIAGNOSIS — E785 Hyperlipidemia, unspecified: Secondary | ICD-10-CM

## 2018-11-22 NOTE — Telephone Encounter (Signed)
Spoke with patient and scheduled follow up visit for 12/01/2018 in office. Is this ok or change to virtual?

## 2018-11-23 NOTE — Telephone Encounter (Signed)
In office is fine

## 2018-12-01 ENCOUNTER — Encounter: Payer: Self-pay | Admitting: Family Medicine

## 2018-12-01 ENCOUNTER — Telehealth: Payer: Self-pay

## 2018-12-01 ENCOUNTER — Other Ambulatory Visit: Payer: Self-pay

## 2018-12-01 ENCOUNTER — Ambulatory Visit (INDEPENDENT_AMBULATORY_CARE_PROVIDER_SITE_OTHER): Payer: Medicare HMO | Admitting: Family Medicine

## 2018-12-01 ENCOUNTER — Other Ambulatory Visit: Payer: Self-pay | Admitting: Family Medicine

## 2018-12-01 VITALS — BP 100/60 | HR 56 | Temp 98.5°F | Ht 73.0 in | Wt 153.8 lb

## 2018-12-01 DIAGNOSIS — R252 Cramp and spasm: Secondary | ICD-10-CM

## 2018-12-01 DIAGNOSIS — I10 Essential (primary) hypertension: Secondary | ICD-10-CM

## 2018-12-01 DIAGNOSIS — E118 Type 2 diabetes mellitus with unspecified complications: Secondary | ICD-10-CM

## 2018-12-01 DIAGNOSIS — Z7901 Long term (current) use of anticoagulants: Secondary | ICD-10-CM

## 2018-12-01 DIAGNOSIS — R972 Elevated prostate specific antigen [PSA]: Secondary | ICD-10-CM | POA: Diagnosis not present

## 2018-12-01 LAB — CBC WITH DIFFERENTIAL/PLATELET
Basophils Absolute: 0.1 10*3/uL (ref 0.0–0.1)
Basophils Relative: 0.7 % (ref 0.0–3.0)
Eosinophils Absolute: 0.1 10*3/uL (ref 0.0–0.7)
Eosinophils Relative: 0.8 % (ref 0.0–5.0)
HCT: 44.1 % (ref 39.0–52.0)
Hemoglobin: 14.5 g/dL (ref 13.0–17.0)
Lymphocytes Relative: 32.8 % (ref 12.0–46.0)
Lymphs Abs: 2.9 10*3/uL (ref 0.7–4.0)
MCHC: 32.8 g/dL (ref 30.0–36.0)
MCV: 91 fl (ref 78.0–100.0)
Monocytes Absolute: 0.6 10*3/uL (ref 0.1–1.0)
Monocytes Relative: 6.7 % (ref 3.0–12.0)
Neutro Abs: 5.2 10*3/uL (ref 1.4–7.7)
Neutrophils Relative %: 59 % (ref 43.0–77.0)
Platelets: 317 10*3/uL (ref 150.0–400.0)
RBC: 4.85 Mil/uL (ref 4.22–5.81)
RDW: 14.4 % (ref 11.5–15.5)
WBC: 8.8 10*3/uL (ref 4.0–10.5)

## 2018-12-01 LAB — COMPREHENSIVE METABOLIC PANEL
ALT: 7 U/L (ref 0–53)
AST: 13 U/L (ref 0–37)
Albumin: 4.4 g/dL (ref 3.5–5.2)
Alkaline Phosphatase: 51 U/L (ref 39–117)
BUN: 11 mg/dL (ref 6–23)
CO2: 30 mEq/L (ref 19–32)
Calcium: 9.5 mg/dL (ref 8.4–10.5)
Chloride: 102 mEq/L (ref 96–112)
Creatinine, Ser: 0.83 mg/dL (ref 0.40–1.50)
GFR: 92.53 mL/min (ref >60.00)
Glucose, Bld: 121 mg/dL — ABNORMAL HIGH (ref 70–99)
Potassium: 4.3 mEq/L (ref 3.5–5.1)
Sodium: 139 mEq/L (ref 135–145)
Total Bilirubin: 0.4 mg/dL (ref 0.2–1.2)
Total Protein: 6.7 g/dL (ref 6.0–8.3)

## 2018-12-01 LAB — HEMOGLOBIN A1C: Hgb A1c MFr Bld: 6.7 % — ABNORMAL HIGH (ref 4.6–6.5)

## 2018-12-01 LAB — PSA: PSA: 15.21 ng/mL — ABNORMAL HIGH (ref 0.10–4.00)

## 2018-12-01 LAB — LIPID PANEL
Cholesterol: 86 mg/dL (ref 0–200)
HDL: 43.7 mg/dL (ref >39.00)
LDL Cholesterol: 31 mg/dL (ref 0–99)
NonHDL: 42.72
Total CHOL/HDL Ratio: 2
Triglycerides: 59 mg/dL (ref 0.0–149.0)
VLDL: 11.8 mg/dL (ref 0.0–40.0)

## 2018-12-01 LAB — MAGNESIUM: Magnesium: 1.9 mg/dL (ref 1.5–2.5)

## 2018-12-01 NOTE — Patient Instructions (Signed)
Good to see you today  I will notify you of labs via Mychart

## 2018-12-01 NOTE — Telephone Encounter (Signed)
Per chart review tab pt has already been seen.

## 2018-12-01 NOTE — Progress Notes (Signed)
Subjective:    Patient ID: Tony Lynch, male    DOB: 05/18/1950, 68 y.o.   MRN: [id number]  HPI This is a 68 yo male who presents today for follow up of chronic medical conditions. He is currently working about 40 hours a week at [employer]  DM type 2-he has been more active with job change, is on his feet all day and does a lot of heavy lifting.  Appetite unchanged but he has lost a few pounds.  Does not check his blood sugars at home. CAD-he continues to have regular follow-up with cardiology and recently had carotid ultrasound that was stable to be repeated in 1 year.  He denies any chest pain, shortness of breath, lower extremity edema. Elevated PSA-he was evaluated by urology last fall following markedly elevated PSA.  He had a prostate biopsy which was negative for malignancy.  He is declining further follow-up with urology at this time.  We discussed rechecking his PSA periodically and he is okay with this.  He was previously riding on a riding lawn more many hours a day and this was thought to potentially have contributed to elevated PSA.  With his job change she is not doing this anymore.   Leg cramps- he notices leg cramps at night a couple of times a week.  They are relieved with a teaspoon of yellow mustard.  This started once he started his new job and is on his feet more.  Past Medical History:  Diagnosis Date  . CAD S/P percutaneous coronary angioplasty 03/28/2014   PCI to proximal and mid LAD: mLAD - Xience Alpine DES 2.5 mm x 18 mm (3.0 mm), pLAD Xience Alpine DES 2.75 mm 18 mm (3.0 mm distal and 3.6 mm proximal.  . Depression   . Diabetes mellitus type II, controlled (HCC)   . OA (osteoarthritis)    hip, back  . Tobacco abuse   . Unstable angina (HCC) 03/28/2014   DES x2 - p-mLAD   Past Surgical History:  Procedure Laterality Date  . EXCISIONAL HEMORRHOIDECTOMY  1980's  . INGUINAL HERNIA REPAIR Right ~ 2010  . LEFT HEART CATHETERIZATION WITH CORONARY ANGIOGRAM  N/A 03/28/2014   Procedure: LEFT HEART CATHETERIZATION WITH CORONARY ANGIOGRAM;  Surgeon: Marykay Lexavid W Harding, MD;  [facility];  Service: CV: pLAD 95% (after SP1 & D1), mLAD 75%, Cx becomes bifurcating OM -> inferior branch 60%, downward takeoff RCA with mid 40-50%.  Marland Kitchen. PERCUTANEOUS CORONARY STENT INTERVENTION (PCI-S)  03/28/2014   Procedure: PERCUTANEOUS CORONARY STENT INTERVENTION (PCI-S);  Surgeon: Marykay Lexavid W Harding, MD;  [facility];  Service: Cardiovascular;;  mLAD - Xience DES 2.5 mm x 18 mm (3.0 mm), pLAD Xience 2.75 mm x 18 mm (3.0 - 3.6 mm)   . TRANSTHORACIC ECHOCARDIOGRAM  06/2014   EF 60-65% - no RWMA.  Gr 1 DD. Mild LA & RA dilation   Family History  Problem Relation Age of Onset  . Diabetes Sister   . Heart disease Sister        died of MI  . Stroke Sister        2 strokes   . Heart disease Brother        multiple cardiac surgeries  . Heart disease Sister        stent  . Cancer Sister        breast cancer  . Heart disease Mother   . Heart disease Sister   . Prostate cancer  Neg Hx   . Colon cancer Neg Hx    Social History   Tobacco Use  . Smoking status: Former Smoker    Packs/day: 1.50    Years: 46.00    Pack years: 69.00    Types: Cigarettes  . Smokeless tobacco: Former Network engineer Use Topics  . Alcohol use: No    Alcohol/week: 0.0 standard drinks  . Drug use: No      Review of Systems Per HPI    Objective:   Physical Exam Physical Exam  Constitutional: Oriented to person, place, and time. He appears well-developed and well-nourished.  HENT:  Head: Normocephalic and atraumatic.  Eyes: Conjunctivae are normal.  Neck: Normal range of motion. Neck supple.  Cardiovascular: Normal rate, regular rhythm and normal heart sounds.   Pulmonary/Chest: Effort normal and breath sounds normal.  Musculoskeletal: Normal range of motion.  Neurological: Alert and oriented to person, place, and time.  Skin: Skin is warm and dry.  Psychiatric: Normal  mood and affect. Behavior is normal. Judgment and thought content normal.  Vitals reviewed.   BP 100/60 (BP Location: Left Arm, Patient Position: Sitting, Cuff Size: Normal)   Pulse (!) 56   Temp 98.5 F (36.9 C) (Temporal)   Ht 6\' 1"  (1.854 m)   Wt 153 lb 12.8 oz (69.8 kg)   SpO2 97%   BMI 20.29 kg/m  Wt Readings from Last 3 Encounters:  12/01/18 153 lb 12.8 oz (69.8 kg)  02/09/18 164 lb 14.4 oz (74.8 kg)  01/26/18 161 lb 3.2 oz (73.1 kg)   BP Readings from Last 3 Encounters:  12/01/18 100/60  02/09/18 118/75  01/26/18 (!) 143/79       Assessment & Plan:  1. Leg cramps -Encouraged him to stretch before going to bed - Comprehensive metabolic panel - Magnesium  2. Type 2 diabetes mellitus with complication - CAD - [id number] need to discontinue his metformin with his weight loss - Lipid Panel  3. Essential hypertension -Well-controlled on lisinopril 2.5 mg.  4. Elevated PSA, between 10 and less than 20 ng/ml -Had prior urology workup with negative biopsy - PSA  5. Chronic anticoagulation -Chronic clopidogrel, denies any signs symptoms of bleeding - CBC with Differential/Platelet   Clarene Reamer, FNP-BC  [facility]  12/01/2018 8:40 AM

## 2018-12-23 DIAGNOSIS — H5213 Myopia, bilateral: Secondary | ICD-10-CM | POA: Diagnosis not present

## 2018-12-23 DIAGNOSIS — Z0100 Encounter for examination of eyes and vision without abnormal findings: Secondary | ICD-10-CM | POA: Diagnosis not present

## 2018-12-29 DIAGNOSIS — R69 Illness, unspecified: Secondary | ICD-10-CM | POA: Diagnosis not present

## 2018-12-30 ENCOUNTER — Encounter (HOSPITAL_COMMUNITY): Payer: Self-pay | Admitting: Emergency Medicine

## 2018-12-30 ENCOUNTER — Other Ambulatory Visit: Payer: Self-pay

## 2018-12-30 ENCOUNTER — Emergency Department (HOSPITAL_COMMUNITY)
Admission: EM | Admit: 2018-12-30 | Discharge: 2018-12-30 | Disposition: A | Discharge status: Home / Self Care | Payer: Medicare HMO | Attending: Emergency Medicine | Admitting: Emergency Medicine

## 2018-12-30 DIAGNOSIS — I10 Essential (primary) hypertension: Secondary | ICD-10-CM | POA: Diagnosis not present

## 2018-12-30 DIAGNOSIS — Z87891 Personal history of nicotine dependence: Secondary | ICD-10-CM | POA: Diagnosis not present

## 2018-12-30 DIAGNOSIS — E119 Type 2 diabetes mellitus without complications: Secondary | ICD-10-CM | POA: Insufficient documentation

## 2018-12-30 DIAGNOSIS — Z79899 Other long term (current) drug therapy: Secondary | ICD-10-CM | POA: Diagnosis not present

## 2018-12-30 DIAGNOSIS — Z7984 Long term (current) use of oral hypoglycemic drugs: Secondary | ICD-10-CM | POA: Diagnosis not present

## 2018-12-30 DIAGNOSIS — R339 Retention of urine, unspecified: Secondary | ICD-10-CM | POA: Diagnosis not present

## 2018-12-30 DIAGNOSIS — I2510 Atherosclerotic heart disease of native coronary artery without angina pectoris: Secondary | ICD-10-CM | POA: Diagnosis not present

## 2018-12-30 DIAGNOSIS — Z7901 Long term (current) use of anticoagulants: Secondary | ICD-10-CM | POA: Insufficient documentation

## 2018-12-30 LAB — URINALYSIS, ROUTINE W REFLEX MICROSCOPIC
Bacteria, UA: NONE SEEN
Bilirubin Urine: NEGATIVE
Glucose, UA: NEGATIVE mg/dL
Ketones, ur: NEGATIVE mg/dL
Leukocytes,Ua: NEGATIVE
Nitrite: NEGATIVE
Protein, ur: NEGATIVE mg/dL
Specific Gravity, Urine: 1.005 (ref 1.005–1.030)
pH: 6 (ref 5.0–8.0)

## 2018-12-30 MED ORDER — TAMSULOSIN HCL 0.4 MG PO CAPS: 0.4000 mg | ORAL_CAPSULE | Freq: Every day | ORAL | 0 refills | Status: DC

## 2018-12-30 NOTE — Discharge Instructions (Signed)
We recommend close follow-up with urology to schedule a bladder challenge and for removal of your Foley catheter.  Take Flomax as prescribed.  You may see your primary care doctor as needed.  Return for any new or concerning symptoms.

## 2018-12-30 NOTE — ED Triage Notes (Signed)
Patient from home, has not been able to urinate since 11pm last night.  Patient having a lot of pain.  No procedures per patient.

## 2018-12-30 NOTE — ED Notes (Signed)
Leg bag placed.  Patient discharged.

## 2018-12-30 NOTE — ED Notes (Signed)
Over 926mL shown on bladder scan

## 2018-12-30 NOTE — ED Provider Notes (Signed)
[facility] EMERGENCY DEPARTMENT Provider Note   CSN: [id number] Arrival date & time: 12/30/18  0258     History   Chief Complaint Chief Complaint  Patient presents with  . Urinary Retention    HPI Tony Lynch is a 68 y.o. male.     68 year old male with a history of CAD status post PCI (on Plavix), HTN, HLD, DM presents to the emergency department for inability to urinate.  He states that he last voided at 2300, but this was a slow stream.  States the urine "just trickled out".  He continued to feel a burning discomfort in his urethra with inability to urinate.  Developed increasing discomfort in his lower abdomen.  Denies any preceding fevers, dysuria, hematuria.  Has been followed by urology in the past for elevated PSA; biopsy of prostate benign.  Found to have >950cc urine in bladder on arrival.  Feels better since foley placed in triage.   The history is provided by the patient. No language interpreter was used.    Past Medical History:  Diagnosis Date  . CAD S/P percutaneous coronary angioplasty 03/28/2014   PCI to proximal and mid LAD: mLAD - Xience Alpine DES 2.5 mm x 18 mm (3.0 mm), pLAD Xience Alpine DES 2.75 mm 18 mm (3.0 mm distal and 3.6 mm proximal.  . Depression   . Diabetes mellitus type II, controlled ([place])   . OA (osteoarthritis)    hip, back  . Tobacco abuse   . Unstable angina ([place]) 03/28/2014   DES x2 - p-mLAD    Patient Active Problem List   Diagnosis Date Noted  . Elevated PSA 01/06/2018  . Left hip pain 04/03/2017  . Finger laceration 09/01/2016  . Acute lumbar back pain 03/09/2015  . Sebaceous cyst 10/15/2014  . Cardiac murmur 06/29/2014  . Right carotid artery occlusion 06/29/2014  . Encounter for long-term (current) use of medications 06/29/2014  . Hyperlipidemia with target LDL less than 70 06/29/2014  . Bradycardia 03/28/2014  . OA (osteoarthritis) 03/28/2014  . Unstable angina ([place]) 03/28/2014  . CAD S/P 2 site PCI  to proximal and mid LAD with a Xience DES stents 03/28/2014    Class: Status post  . Presence of drug coated stent in LAD coronary artery: Xience Alpine DES 2.75 mm x 18 mm (prox - 3.6 mm), 2.5 mm x 18 mm (~2.9 mm) mid 03/28/2014    Class: Status post  . Type 2 diabetes mellitus with complication - CAD 16/10/3708  . Essential hypertension 06/16/2013  . Former heavy tobacco smoker; quit December 2015 06/16/2013    Past Surgical History:  Procedure Laterality Date  . EXCISIONAL HEMORRHOIDECTOMY  1980's  . INGUINAL HERNIA REPAIR Right ~ 2010  . LEFT HEART CATHETERIZATION WITH CORONARY ANGIOGRAM N/A 03/28/2014   Procedure: LEFT HEART CATHETERIZATION WITH CORONARY ANGIOGRAM;  Surgeon: Leonie Man, MD;  Location: [facility] CATH LAB;  Service: CV: pLAD 95% (after SP1 & D1), mLAD 75%, Cx becomes bifurcating OM -> inferior branch 60%, downward takeoff RCA with mid 40-50%.  Marland Kitchen PERCUTANEOUS CORONARY STENT INTERVENTION (PCI-S)  03/28/2014   Procedure: PERCUTANEOUS CORONARY STENT INTERVENTION (PCI-S);  Surgeon: Leonie Man, MD;  Location: [facility] CATH LAB;  Service: Cardiovascular;;  mLAD - Xience DES 2.5 mm x 18 mm (3.0 mm), pLAD Xience 2.75 mm x 18 mm (3.0 - 3.6 mm)   . TRANSTHORACIC ECHOCARDIOGRAM  06/2014   EF 60-65% - no RWMA.  Gr 1 DD. Mild LA & RA  dilation        Home Medications    Prior to Admission medications   Medication Sig Start Date End Date Taking? Authorizing Provider  clopidogrel (PLAVIX) 75 MG tablet TAKE 1 TABLET BY MOUTH EVERY DAY 07/02/18   Emi Belfast, FNP  lisinopril (ZESTRIL) 2.5 MG tablet TAKE 1 TABLET BY MOUTH EVERY DAY 12/02/18   Emi Belfast, FNP  metFORMIN (GLUCOPHAGE) 500 MG tablet TAKE 1 TABLET BY MOUTH TWICE A DAY WITH MEALS 12/02/18   Emi Belfast, FNP  nitroGLYCERIN (NITROSTAT) 0.4 MG SL tablet Place 1 tablet (0.4 mg total) under the tongue every 5 (five) minutes as needed for chest pain. 10/12/17   Joaquim Nam, MD  rosuvastatin (CRESTOR) 20 MG tablet  TAKE 1 TABLET BY MOUTH EVERY DAY 11/22/18   Emi Belfast, FNP  tamsulosin (FLOMAX) 0.4 MG CAPS capsule Take 1 capsule (0.4 mg total) by mouth daily. 12/30/18   Antony Madura, PA-C    Family History Family History  Problem Relation Age of Onset  . Diabetes Sister   . Heart disease Sister        died of MI  . Stroke Sister        2 strokes   . Heart disease Brother        multiple cardiac surgeries  . Heart disease Sister        stent  . Cancer Sister        breast cancer  . Heart disease Mother   . Heart disease Sister   . Prostate cancer Neg Hx   . Colon cancer Neg Hx     Social History Social History   Tobacco Use  . Smoking status: Former Smoker    Packs/day: 1.50    Years: 46.00    Pack years: 69.00    Types: Cigarettes  . Smokeless tobacco: Former Engineer, water Use Topics  . Alcohol use: No    Alcohol/week: 0.0 standard drinks  . Drug use: No     Allergies   Patient has no known allergies.   Review of Systems Review of Systems Ten systems reviewed and are negative for acute change, except as noted in the HPI.    Physical Exam Updated Vital Signs BP (!) 146/81 (BP Location: Right Arm)   Pulse (!) 44   Temp 97.9 F (36.6 C) (Oral)   Resp 18   SpO2 100%   Physical Exam Vitals signs and nursing note reviewed.  Constitutional:      General: He is not in acute distress.    Appearance: He is well-developed. He is not diaphoretic.     Comments: Nontoxic appearing and in NAD  HENT:     Head: Normocephalic and atraumatic.  Eyes:     General: No scleral icterus.    Conjunctiva/sclera: Conjunctivae normal.  Neck:     Musculoskeletal: Normal range of motion.  Pulmonary:     Effort: Pulmonary effort is normal. No respiratory distress.     Comments: Respirations even and unlabored Genitourinary:    Comments: Foley catheter in place. Yellow, nonbloody urine noted in foley bag. Musculoskeletal: Normal range of motion.  Skin:    General: Skin is  warm and dry.     Coloration: Skin is not pale.     Findings: No erythema or rash.  Neurological:     Mental Status: He is alert and oriented to person, place, and time.  Psychiatric:        Behavior:  Behavior normal.      ED Treatments / Results  Labs (all labs ordered are listed, but only abnormal results are displayed) Labs Reviewed  URINALYSIS, ROUTINE W REFLEX MICROSCOPIC - Abnormal; Notable for the following components:      Result Value   Hgb urine dipstick MODERATE (*)    All other components within normal limits  URINE CULTURE    EKG None  Radiology No results found.  Procedures Procedures (including critical care time)  Medications Ordered in ED Medications - No data to display   Initial Impression / Assessment and Plan / ED Course  I have reviewed the triage vital signs and the nursing notes.  Pertinent labs & imaging results that were available during my care of the patient were reviewed by me and considered in my medical decision making (see chart for details).        Patient with uncomplicated urinary retention.  Foley catheter was placed with ease by nurse tech.  This relieved the patient's discomfort.  Urinalysis without evidence of infection.  Will refer to urology for follow up.  Started on Flomax at time of d/c.  Return precautions discussed and provided. Patient discharged in stable condition with no unaddressed concerns.   Final Clinical Impressions(s) / ED Diagnoses   Final diagnoses:  Urinary retention    ED Discharge Orders         Ordered    tamsulosin (FLOMAX) 0.4 MG CAPS capsule  Daily     12/30/18 0511           Antony MaduraHumes, Judah Chevere, PA-C 12/30/18 [id number]    Derwood KaplanNanavati, Ankit, MD 12/30/18 [id number]

## 2018-12-31 LAB — URINE CULTURE: Culture: NO GROWTH

## 2019-01-01 ENCOUNTER — Other Ambulatory Visit: Payer: Self-pay | Admitting: Family Medicine

## 2019-01-01 NOTE — Telephone Encounter (Signed)
LOV 12/01/2018 Last refilled by Dr Damita Dunnings on 10/12/17 #25 with 1 refill

## 2019-01-12 ENCOUNTER — Ambulatory Visit: Payer: Medicare HMO

## 2019-01-12 ENCOUNTER — Ambulatory Visit (INDEPENDENT_AMBULATORY_CARE_PROVIDER_SITE_OTHER): Payer: Medicare HMO

## 2019-01-12 ENCOUNTER — Other Ambulatory Visit: Payer: Self-pay

## 2019-01-12 DIAGNOSIS — R339 Retention of urine, unspecified: Secondary | ICD-10-CM | POA: Diagnosis not present

## 2019-01-12 NOTE — Progress Notes (Signed)
Fill and Pull Catheter Removal  Patient is present today for a catheter removal.  Patient was cleaned and prepped in a sterile fashion 23ml of sterile water/ saline was instilled into the bladder when the patient felt the urge to urinate. 52ml of water was then drained from the balloon.  A 16FR foley cath was removed from the bladder no complications were noted .  Patient as then given some time to void on their own.  Patient can void  264ml on their own after some time.  Patient tolerated well.  Performed by: Fonnie Jarvis, CMA  Follow up/ Additional notes: per Dr. Diamantina Providence patient to return in 4-6wk for a follow up with [facility]

## 2019-01-13 ENCOUNTER — Ambulatory Visit: Payer: Medicare HMO

## 2019-01-13 DIAGNOSIS — R339 Retention of urine, unspecified: Secondary | ICD-10-CM | POA: Diagnosis not present

## 2019-01-13 LAB — URINALYSIS, COMPLETE
Bilirubin, UA: NEGATIVE
Glucose, UA: NEGATIVE
Ketones, UA: NEGATIVE
Leukocytes,UA: NEGATIVE
Nitrite, UA: NEGATIVE
Protein,UA: NEGATIVE
RBC, UA: NEGATIVE
Specific Gravity, UA: 1.01 (ref 1.005–1.030)
Urobilinogen, Ur: 0.2 mg/dL (ref 0.2–1.0)
pH, UA: 6.5 (ref 5.0–7.5)

## 2019-01-13 LAB — MICROSCOPIC EXAMINATION
Bacteria, UA: NONE SEEN
Epithelial Cells (non renal): NONE SEEN /hpf (ref 0–10)

## 2019-01-13 MED ORDER — TAMSULOSIN HCL 0.4 MG PO CAPS: 0.4000 mg | ORAL_CAPSULE | Freq: Every day | ORAL | 11 refills | Status: DC

## 2019-01-13 NOTE — Progress Notes (Unsigned)
Patient present to office today post fill and pull yesterday morning which was successful. Patient states he has been up and down all night with the urge to go and only a little comes out. Patient is able to urinate this morning and a UA was collected. PVR is 216ml. Patient states he ran out of the Tamsulosin given at ER 3 days ago. Per Dr. Diamantina Providence patient to restart Tamsulosin and follow up in 1-2wk with a PVR and cancel 4-6 week follow up. Patient was offered to learn CIC incase he is unable to urinate in the future but refused.

## 2019-01-16 LAB — CULTURE, URINE COMPREHENSIVE

## 2019-01-27 ENCOUNTER — Ambulatory Visit: Payer: Medicare HMO | Admitting: Urology

## 2019-01-27 ENCOUNTER — Encounter: Payer: Self-pay | Admitting: Urology

## 2019-02-14 ENCOUNTER — Ambulatory Visit: Payer: Medicare HMO | Admitting: Urology

## 2019-02-15 ENCOUNTER — Other Ambulatory Visit: Payer: Self-pay | Admitting: Family Medicine

## 2019-02-15 DIAGNOSIS — E785 Hyperlipidemia, unspecified: Secondary | ICD-10-CM

## 2019-07-13 ENCOUNTER — Other Ambulatory Visit: Payer: Self-pay

## 2019-07-13 DIAGNOSIS — Z021 Encounter for pre-employment examination: Secondary | ICD-10-CM

## 2019-07-13 NOTE — Progress Notes (Signed)
Presents for pre-employment drug screen. Specimen collected using [employer] Chain of Custody form for [facility] account number [id number]. Specimen ID [id number]  AMD

## 2019-08-16 ENCOUNTER — Other Ambulatory Visit: Payer: Self-pay | Admitting: Family Medicine

## 2019-08-16 DIAGNOSIS — E785 Hyperlipidemia, unspecified: Secondary | ICD-10-CM

## 2019-08-16 DIAGNOSIS — M545 Low back pain: Secondary | ICD-10-CM | POA: Diagnosis not present

## 2019-08-16 DIAGNOSIS — E118 Type 2 diabetes mellitus with unspecified complications: Secondary | ICD-10-CM | POA: Diagnosis not present

## 2019-08-16 DIAGNOSIS — M5410 Radiculopathy, site unspecified: Secondary | ICD-10-CM | POA: Diagnosis not present

## 2019-08-17 NOTE — Telephone Encounter (Signed)
Rx was last refilled 02/15/19 for #90 with 1 refill. Patient was last seen on 12/01/18 and has no upcoming appt. Eunice Blase, is this ok to refill?

## 2019-08-19 ENCOUNTER — Ambulatory Visit: Payer: Medicare HMO | Admitting: Family Medicine

## 2019-08-30 ENCOUNTER — Other Ambulatory Visit: Payer: Self-pay | Admitting: Family Medicine

## 2019-08-30 DIAGNOSIS — E118 Type 2 diabetes mellitus with unspecified complications: Secondary | ICD-10-CM

## 2019-10-07 ENCOUNTER — Inpatient Hospital Stay (HOSPITAL_COMMUNITY): Admission: RE | Admit: 2019-10-07 | Payer: Medicare HMO | Source: Ambulatory Visit

## 2019-12-01 ENCOUNTER — Telehealth: Payer: Self-pay | Admitting: Family Medicine

## 2019-12-01 DIAGNOSIS — E118 Type 2 diabetes mellitus with unspecified complications: Secondary | ICD-10-CM

## 2019-12-01 NOTE — Telephone Encounter (Signed)
Will route to Robin to get f/u or CPE scheduled pt hasn't been seen in a year and no future appts. Scheduled.  Robin please route back to [employer] once appt has been made so we can refill med

## 2019-12-01 NOTE — Telephone Encounter (Signed)
Left message asking pt to call office  Holland, Shapale M, CMA  [place] I routed a refill request to you on this pt of Tony Lynch pt just needs a f/u or CPE, before we can fill meds because he hasn't been seen in a year and no future appts., thanks   FYI once you are done making appt you can route the refill request back to [name]

## 2019-12-06 NOTE — Telephone Encounter (Signed)
Spoke with pt he stated he has plenty of meds and will call back to schedule

## 2019-12-09 NOTE — Telephone Encounter (Signed)
Note Spoke with pt he stated he has plenty of meds and will call back to schedule      see phone note dated 8/5

## 2020-01-13 ENCOUNTER — Telehealth: Payer: Self-pay | Admitting: Family Medicine

## 2020-01-13 NOTE — Telephone Encounter (Signed)
LVM for pt to rtm my call to r/s appt with [facility]

## 2020-01-19 ENCOUNTER — Other Ambulatory Visit: Payer: Self-pay | Admitting: Family Medicine

## 2020-01-19 DIAGNOSIS — E118 Type 2 diabetes mellitus with unspecified complications: Secondary | ICD-10-CM

## 2020-01-20 NOTE — Telephone Encounter (Signed)
Per notes on 12/01/2019 once pt makes appt we can route refill request to [facility].  Pt does have upcoming appts. 01/2020 and 02/2020.

## 2020-01-23 NOTE — Telephone Encounter (Signed)
Unsure why this was routed to me.Tony Lynch - patient does have a CPE scheduled with you for 02/27/20. Metformin was last refilled 08/31/19 for #180 with 0 refills  Lisinopril was last refilled 08/31/19 for #90 with 0 refills   Ok to refill?

## 2020-02-12 ENCOUNTER — Other Ambulatory Visit: Payer: Self-pay | Admitting: Family Medicine

## 2020-02-12 DIAGNOSIS — E785 Hyperlipidemia, unspecified: Secondary | ICD-10-CM

## 2020-02-12 DIAGNOSIS — I10 Essential (primary) hypertension: Secondary | ICD-10-CM

## 2020-02-12 DIAGNOSIS — Z125 Encounter for screening for malignant neoplasm of prostate: Secondary | ICD-10-CM

## 2020-02-12 DIAGNOSIS — E118 Type 2 diabetes mellitus with unspecified complications: Secondary | ICD-10-CM

## 2020-02-13 ENCOUNTER — Ambulatory Visit: Payer: Medicare HMO

## 2020-02-16 ENCOUNTER — Telehealth: Payer: Self-pay | Admitting: Family Medicine

## 2020-02-16 NOTE — Telephone Encounter (Signed)
Called and spoke to patient regarding his Covid status. He has symptoms for 2 weeks, productive cough, runny nose, one day of fever. Not immunized. Currently with a little non productive cough, no fever, wheeze or SOB. He is outside window of monoclonal antibody infusion. He was instructed to follow up if symptoms return.

## 2020-02-16 NOTE — Telephone Encounter (Signed)
Pt called to r/s appointment.  He has covid.  He tested Tuesday 10/19 @ work  [employer].    Pt stated he has cough  no sob/difficulty breathing Minor fever

## 2020-02-16 NOTE — Telephone Encounter (Signed)
I have sent a message for him to be screened for monoclonal antibody infusion to Mayra Reel, NP.

## 2020-02-17 ENCOUNTER — Ambulatory Visit: Payer: Medicare HMO

## 2020-02-20 ENCOUNTER — Other Ambulatory Visit: Payer: Medicare HMO

## 2020-02-27 ENCOUNTER — Encounter: Payer: Medicare HMO | Admitting: Family Medicine

## 2020-03-12 ENCOUNTER — Other Ambulatory Visit: Payer: Self-pay | Admitting: Family Medicine

## 2020-03-12 DIAGNOSIS — E118 Type 2 diabetes mellitus with unspecified complications: Secondary | ICD-10-CM

## 2020-03-16 ENCOUNTER — Ambulatory Visit: Payer: Medicare HMO

## 2020-03-16 ENCOUNTER — Other Ambulatory Visit (INDEPENDENT_AMBULATORY_CARE_PROVIDER_SITE_OTHER): Payer: Medicare HMO

## 2020-03-16 ENCOUNTER — Other Ambulatory Visit: Payer: Self-pay

## 2020-03-16 ENCOUNTER — Telehealth: Payer: Self-pay

## 2020-03-16 DIAGNOSIS — E785 Hyperlipidemia, unspecified: Secondary | ICD-10-CM

## 2020-03-16 DIAGNOSIS — I10 Essential (primary) hypertension: Secondary | ICD-10-CM | POA: Diagnosis not present

## 2020-03-16 DIAGNOSIS — Z125 Encounter for screening for malignant neoplasm of prostate: Secondary | ICD-10-CM

## 2020-03-16 DIAGNOSIS — E118 Type 2 diabetes mellitus with unspecified complications: Secondary | ICD-10-CM | POA: Diagnosis not present

## 2020-03-16 LAB — LIPID PANEL
Cholesterol: 89 mg/dL (ref 0–200)
HDL: 50.3 mg/dL (ref >39.00)
LDL Cholesterol: 31 mg/dL (ref 0–99)
NonHDL: 38.55
Total CHOL/HDL Ratio: 2
Triglycerides: 37 mg/dL (ref 0.0–149.0)
VLDL: 7.4 mg/dL (ref 0.0–40.0)

## 2020-03-16 LAB — COMPREHENSIVE METABOLIC PANEL
ALT: 9 U/L (ref 0–53)
AST: 14 U/L (ref 0–37)
Albumin: 4.1 g/dL (ref 3.5–5.2)
Alkaline Phosphatase: 50 U/L (ref 39–117)
BUN: 14 mg/dL (ref 6–23)
CO2: 29 mEq/L (ref 19–32)
Calcium: 9.1 mg/dL (ref 8.4–10.5)
Chloride: 103 mEq/L (ref 96–112)
Creatinine, Ser: 0.86 mg/dL (ref 0.40–1.50)
GFR: 89.36 mL/min (ref >60.00)
Glucose, Bld: 115 mg/dL — ABNORMAL HIGH (ref 70–99)
Potassium: 4.3 mEq/L (ref 3.5–5.1)
Sodium: 140 mEq/L (ref 135–145)
Total Bilirubin: 0.6 mg/dL (ref 0.2–1.2)
Total Protein: 6.4 g/dL (ref 6.0–8.3)

## 2020-03-16 LAB — CBC WITH DIFFERENTIAL/PLATELET
Basophils Absolute: 0 10*3/uL (ref 0.0–0.1)
Basophils Relative: 0.7 % (ref 0.0–3.0)
Eosinophils Absolute: 0 10*3/uL (ref 0.0–0.7)
Eosinophils Relative: 0.6 % (ref 0.0–5.0)
HCT: 39.6 % (ref 39.0–52.0)
Hemoglobin: 13.3 g/dL (ref 13.0–17.0)
Lymphocytes Relative: 31.1 % (ref 12.0–46.0)
Lymphs Abs: 2.1 10*3/uL (ref 0.7–4.0)
MCHC: 33.6 g/dL (ref 30.0–36.0)
MCV: 90.3 fl (ref 78.0–100.0)
Monocytes Absolute: 0.5 10*3/uL (ref 0.1–1.0)
Monocytes Relative: 8.2 % (ref 3.0–12.0)
Neutro Abs: 3.9 10*3/uL (ref 1.4–7.7)
Neutrophils Relative %: 59.4 % (ref 43.0–77.0)
Platelets: 323 10*3/uL (ref 150.0–400.0)
RBC: 4.39 Mil/uL (ref 4.22–5.81)
RDW: 14.5 % (ref 11.5–15.5)
WBC: 6.6 10*3/uL (ref 4.0–10.5)

## 2020-03-16 LAB — VITAMIN B12: Vitamin B-12: 226 pg/mL (ref 211–911)

## 2020-03-16 LAB — HEMOGLOBIN A1C: Hgb A1c MFr Bld: 6.5 % (ref 4.6–6.5)

## 2020-03-16 LAB — MICROALBUMIN / CREATININE URINE RATIO
Creatinine,U: 65.5 mg/dL
Microalb Creat Ratio: 1.4 mg/g (ref 0.0–30.0)
Microalb, Ur: 0.9 mg/dL (ref 0.0–1.9)

## 2020-03-16 LAB — PSA, MEDICARE: PSA: 12 ng/ml — ABNORMAL HIGH (ref 0.10–4.00)

## 2020-03-16 NOTE — Telephone Encounter (Signed)
Called patient 3 times trying to complete his AWV. Patient never answered. Left message on voicemail notifying patient appointment was cancelled and that he can call and reschedule or provider might complete at his physical.

## 2020-03-16 NOTE — Progress Notes (Deleted)
Subjective:   Tony Lynch is a 69 y.o. male who presents for Medicare Annual/Subsequent preventive examination.  Review of Systems: N/A      I connected with the patient today by telephone and verified that I am speaking with the correct person using two identifiers. Location patient: home Location nurse: work Persons participating in the telephone visit: patient, nurse.   I discussed the limitations, risks, security and privacy concerns of performing an evaluation and management service by telephone and the availability of in person appointments. I also discussed with the patient that there may be a patient responsible charge related to this service. The patient expressed understanding and verbally consented to this telephonic visit.              Objective:    There were no vitals filed for this visit. There is no height or weight on file to calculate BMI.  Advanced Directives 12/30/2018 08/30/2016 03/28/2014  Does Patient Have a Medical Advance Directive? No No No  Would patient like information on creating a medical advance directive? - No - Patient declined No - patient declined information    Current Medications (verified) Outpatient Encounter Medications as of 03/16/2020  Medication Sig  . clopidogrel (PLAVIX) 75 MG tablet TAKE 1 TABLET BY MOUTH EVERY DAY  . lisinopril (ZESTRIL) 2.5 MG tablet TAKE 1 TABLET BY MOUTH EVERY DAY  . metFORMIN (GLUCOPHAGE) 500 MG tablet TAKE 1 TABLET BY MOUTH TWICE A DAY WITH MEALS  . nitroGLYCERIN (NITROSTAT) 0.4 MG SL tablet PLACE 1 TABLET UNDER THE TONGUE EVERY 5 MINUTES AS NEEDED FOR CHEST PAIN  . rosuvastatin (CRESTOR) 20 MG tablet TAKE 1 TABLET BY MOUTH EVERY DAY  . tamsulosin (FLOMAX) 0.4 MG CAPS capsule Take 1 capsule (0.4 mg total) by mouth daily.   No facility-administered encounter medications on file as of 03/16/2020.    Allergies (verified) Patient has no known allergies.   History: Past Medical History:  Diagnosis Date    . CAD S/P percutaneous coronary angioplasty 03/28/2014   PCI to proximal and mid LAD: mLAD - Xience Alpine DES 2.5 mm x 18 mm (3.0 mm), pLAD Xience Alpine DES 2.75 mm 18 mm (3.0 mm distal and 3.6 mm proximal.  . Depression   . Diabetes mellitus type II, controlled (HCC)   . OA (osteoarthritis)    hip, back  . Tobacco abuse   . Unstable angina (HCC) 03/28/2014   DES x2 - p-mLAD   Past Surgical History:  Procedure Laterality Date  . EXCISIONAL HEMORRHOIDECTOMY  1980's  . INGUINAL HERNIA REPAIR Right ~ 2010  . LEFT HEART CATHETERIZATION WITH CORONARY ANGIOGRAM N/A 03/28/2014   Procedure: LEFT HEART CATHETERIZATION WITH CORONARY ANGIOGRAM;  Surgeon: Marykay Lex, MD;  Location: [facility] CATH LAB;  Service: CV: pLAD 95% (after SP1 & D1), mLAD 75%, Cx becomes bifurcating OM -> inferior branch 60%, downward takeoff RCA with mid 40-50%.  Marland Kitchen PERCUTANEOUS CORONARY STENT INTERVENTION (PCI-S)  03/28/2014   Procedure: PERCUTANEOUS CORONARY STENT INTERVENTION (PCI-S);  Surgeon: Marykay Lex, MD;  Location: [facility] CATH LAB;  Service: Cardiovascular;;  mLAD - Xience DES 2.5 mm x 18 mm (3.0 mm), pLAD Xience 2.75 mm x 18 mm (3.0 - 3.6 mm)   . TRANSTHORACIC ECHOCARDIOGRAM  06/2014   EF 60-65% - no RWMA.  Gr 1 DD. Mild LA & RA dilation   Family History  Problem Relation Age of Onset  . Diabetes Sister   . Heart disease Sister  died of MI  . Stroke Sister        2 strokes   . Heart disease Brother        multiple cardiac surgeries  . Heart disease Sister        stent  . Cancer Sister        breast cancer  . Heart disease Mother   . Heart disease Sister   . Prostate cancer Neg Hx   . Colon cancer Neg Hx    Social History   Socioeconomic History  . Marital status: Divorced    Spouse name: Not on file  . Number of children: Not on file  . Years of education: Not on file  . Highest education level: Not on file  Occupational History  . Not on file  Tobacco Use  . Smoking status: Former Smoker     Packs/day: 1.50    Years: 46.00    Pack years: 69.00    Types: Cigarettes  . Smokeless tobacco: Former Engineer, water and Sexual Activity  . Alcohol use: No    Alcohol/week: 0.0 standard drinks  . Drug use: No  . Sexual activity: Not on file  Other Topics Concern  . Not on file  Social History Narrative   Smoker 1.5 ppd x 50 years    Lives alone    1 son and 1 daughter, 2 grandsons, divorced    Works at Systems analyst course full time    5/6 siblings alive    Herbalist, Archivist   Social Determinants of Health   Financial Resource Strain:   . Difficulty of Paying Living Expenses: Not on file  Food Insecurity:   . Worried About Programme researcher, broadcasting/film/video in the Last Year: Not on file  . Ran Out of Food in the Last Year: Not on file  Transportation Needs:   . Lack of Transportation (Medical): Not on file  . Lack of Transportation (Non-Medical): Not on file  Physical Activity:   . Days of Exercise per Week: Not on file  . Minutes of Exercise per Session: Not on file  Stress:   . Feeling of Stress : Not on file  Social Connections:   . Frequency of Communication with Friends and Family: Not on file  . Frequency of Social Gatherings with Friends and Family: Not on file  . Attends Religious Services: Not on file  . Active Member of Clubs or Organizations: Not on file  . Attends Banker Meetings: Not on file  . Marital Status: Not on file    Tobacco Counseling Counseling given: Not Answered   Clinical Intake:                 Diabetic: Yes Nutrition Risk Assessment:  Has the patient had any N/V/D within the last 2 months?  {YES/NO:21197} Does the patient have any non-healing wounds?  {YES/NO:21197} Has the patient had any unintentional weight loss or weight gain?  {YES/NO:21197}  Diabetes:  Is the patient diabetic?  Yes  If diabetic, was a CBG obtained today?  No  Did the patient bring in their glucometer from home?  No, telephone visit  How often do  you monitor your CBG's? ***.   Financial Strains and Diabetes Management:  Are you having any financial strains with the device, your supplies or your medication? No .  Does the patient want to be seen by Chronic Care Management for management of their diabetes?  No  Would the patient like  to be referred to a Nutritionist or for Diabetic Management?  No   Diabetic Exams:  {Diabetic Eye Exam:2101801} Diabetic Foot Exam: Overdue, Pt has been advised about the importance in completing this exam. Pt is scheduled for diabetic foot exam on 03/19/2020.          Activities of Daily Living No flowsheet data found.  Patient Care Team: Emi BelfastGessner, Deborah B, FNP as PCP - General (Nurse Practitioner) Marykay LexHarding, David W, MD as PCP - Cardiology (Cardiology)  Indicate any recent Medical Services you may have received from other than [facility] providers in the past year (date may be approximate).     Assessment:   This is a routine wellness examination for Ramon Dredgedward.  Hearing/Vision screen No exam data present  Dietary issues and exercise activities discussed:    Goals   None    Depression Screen PHQ 2/9 Scores 12/09/2017 05/05/2013  PHQ - 2 Score 0 4  PHQ- 9 Score - 8    Fall Risk Fall Risk  12/09/2017 05/05/2013  Falls in the past year? No No    Any stairs in or around the home? Yes  If so, are there any without handrails? No  Home free of loose throw rugs in walkways, pet beds, electrical cords, etc? Yes  Adequate lighting in your home to reduce risk of falls? Yes   ASSISTIVE DEVICES UTILIZED TO PREVENT FALLS:  Life alert? {YES/NO:21197} Use of a cane, walker or w/c? {YES/NO:21197} Grab bars in the bathroom? {YES/NO:21197} Shower chair or bench in shower? {YES/NO:21197} Elevated toilet seat or a handicapped toilet? {YES/NO:21197}  TIMED UP AND GO:  Was the test performed? N/A, telephone visit .    Cognitive Function:       Mini Cog  Mini-Cog screen was completed. Maximum  score is 22. A value of 0 denotes this part of the MMSE was not completed or the patient failed this part of the Mini-Cog screening.  Immunizations Immunization History  Administered Date(s) Administered  . Influenza, High Dose Seasonal PF 01/07/2018, 12/29/2018  . Influenza, Seasonal, Injecte, Preservative Fre 02/06/2016  . Influenza,inj,Quad PF,6+ Mos 03/29/2014, 01/26/2015  . Pneumococcal Conjugate-13 12/09/2017  . Pneumococcal Polysaccharide-23 03/29/2014  . Tdap 06/02/2014, 08/30/2016    TDAP status: Up to date {Flu Vaccine status:2101806} {Pneumococcal vaccine status:2101807} {Covid-19 vaccine status:2101808}  Qualifies for Shingles Vaccine? Yes   Zostavax completed No   Shingrix Completed?: No.    Education has been provided regarding the importance of this vaccine. Patient has been advised to call insurance company to determine out of pocket expense if they have not yet received this vaccine. Advised may also receive vaccine at local pharmacy or Health Dept. Verbalized acceptance and understanding.  Screening Tests Health Maintenance  Topic Date Due  . OPHTHALMOLOGY EXAM  Never done  . COVID-19 Vaccine (1) Never done  . COLONOSCOPY  Never done  . FOOT EXAM  12/10/2018  . PNA vac Low Risk Adult (2 of 2 - PPSV23) 03/30/2019  . HEMOGLOBIN A1C  06/03/2019  . INFLUENZA VACCINE  11/27/2019  . TETANUS/TDAP  08/31/2026  . Hepatitis C Screening  Completed    Health Maintenance  Health Maintenance Due  Topic Date Due  . OPHTHALMOLOGY EXAM  Never done  . COVID-19 Vaccine (1) Never done  . COLONOSCOPY  Never done  . FOOT EXAM  12/10/2018  . PNA vac Low Risk Adult (2 of 2 - PPSV23) 03/30/2019  . HEMOGLOBIN A1C  06/03/2019  . INFLUENZA VACCINE  11/27/2019    {  Colorectal cancer screening:2101809}  Lung Cancer Screening: (Low Dose CT Chest recommended if Age 78-80 years, 30 pack-year currently smoking OR have quit w/in 15years.) does not qualify.    Additional  Screening:  Hepatitis C Screening: does qualify; Completed 08/20/2015  Vision Screening: Recommended annual ophthalmology exams for early detection of glaucoma and other disorders of the eye. Is the patient up to date with their annual eye exam?  {YES/NO:21197} Who is the provider or what is the name of the office in which the patient attends annual eye exams? *** If pt is not established with a provider, would they like to be referred to a provider to establish care? No .   Dental Screening: Recommended annual dental exams for proper oral hygiene  Community Resource Referral / Chronic Care Management: CRR required this visit?  No   CCM required this visit?  No      Plan:     I have personally reviewed and noted the following in the patient's chart:   . Medical and social history . Use of alcohol, tobacco or illicit drugs  . Current medications and supplements . Functional ability and status . Nutritional status . Physical activity . Advanced directives . List of other physicians . Hospitalizations, surgeries, and ER visits in previous 12 months . Vitals . Screenings to include cognitive, depression, and falls . Referrals and appointments  In addition, I have reviewed and discussed with patient certain preventive protocols, quality metrics, and best practice recommendations. A written personalized care plan for preventive services as well as general preventive health recommendations were provided to patient.   Due to this being a telephonic visit, the after visit summary with patients personalized plan was offered to patient via office or my-chart. Patient preferred to pick up at office at next visit or via mychart.   Janalyn Shy, LPN   [id number]

## 2020-03-19 ENCOUNTER — Encounter: Payer: Medicare HMO | Admitting: Family Medicine

## 2020-03-21 ENCOUNTER — Ambulatory Visit: Payer: Medicare HMO

## 2020-03-26 ENCOUNTER — Other Ambulatory Visit: Payer: Self-pay | Admitting: Family Medicine

## 2020-03-26 DIAGNOSIS — E118 Type 2 diabetes mellitus with unspecified complications: Secondary | ICD-10-CM

## 2020-03-26 NOTE — Telephone Encounter (Signed)
Pharmacy requests refill on: Metformin HCL 500 mg   LAST REFILL: 03/12/2020 (Q-30, R-0) LAST OV: 12/01/2018 NEXT OV: Not Scheduled  PHARMACY: [id number] [place], [place]  HgbA1C (12/01/2018): 6.7  Pharmacy requests refill on: Lisinopril 2.5 mg   LAST REFILL: 03/12/2020 (Q-30, R-0) LAST OV: 12/01/2018 NEXT OV: Not Scheduled  PHARMACY: [id number] [place], [place]   Too early for refill.

## 2020-03-27 ENCOUNTER — Other Ambulatory Visit: Payer: Self-pay | Admitting: Family Medicine

## 2020-03-27 DIAGNOSIS — E785 Hyperlipidemia, unspecified: Secondary | ICD-10-CM

## 2020-03-27 NOTE — Telephone Encounter (Signed)
Pharmacy requests refill on: Rosuvastatin Calcium 20 mg  LAST REFILL: 08/17/2019  LAST OV: 12/01/2018 NEXT OV: Not Scheduled PHARMACY: [id number] [place], [place]

## 2020-04-18 ENCOUNTER — Ambulatory Visit: Payer: Medicare HMO

## 2020-05-07 ENCOUNTER — Other Ambulatory Visit: Payer: Self-pay

## 2020-05-07 ENCOUNTER — Encounter: Payer: Self-pay | Admitting: Family Medicine

## 2020-05-07 ENCOUNTER — Ambulatory Visit (INDEPENDENT_AMBULATORY_CARE_PROVIDER_SITE_OTHER): Payer: Medicare HMO | Admitting: Family Medicine

## 2020-05-07 VITALS — BP 140/58 | HR 38 | Temp 97.1°F | Ht 70.5 in | Wt 148.5 lb

## 2020-05-07 DIAGNOSIS — Z0000 Encounter for general adult medical examination without abnormal findings: Secondary | ICD-10-CM | POA: Diagnosis not present

## 2020-05-07 DIAGNOSIS — R972 Elevated prostate specific antigen [PSA]: Secondary | ICD-10-CM

## 2020-05-07 DIAGNOSIS — E118 Type 2 diabetes mellitus with unspecified complications: Secondary | ICD-10-CM | POA: Diagnosis not present

## 2020-05-07 DIAGNOSIS — I10 Essential (primary) hypertension: Secondary | ICD-10-CM | POA: Diagnosis not present

## 2020-05-07 MED ORDER — LISINOPRIL 2.5 MG PO TABS: 2.5000 mg | ORAL_TABLET | Freq: Every day | ORAL | 3 refills | Status: DC

## 2020-05-07 MED ORDER — METFORMIN HCL 500 MG PO TABS: 500.0000 mg | ORAL_TABLET | Freq: Two times a day (BID) | ORAL | 3 refills | Status: DC

## 2020-05-07 NOTE — Progress Notes (Signed)
Subjective:   Tony Lynch is a 70 y.o. male who presents for Medicare Annual/Subsequent preventive examination.  Review of Systems    Review of Systems  Constitutional: Negative for chills and fever.  HENT: Negative for congestion and sore throat.   Eyes: Negative for blurred vision and double vision.  Respiratory: Negative for shortness of breath.   Cardiovascular: Negative for chest pain.  Gastrointestinal: Negative for heartburn, nausea and vomiting.  Genitourinary: Negative.   Musculoskeletal: Negative.  Negative for myalgias.  Skin: Negative for rash.  Neurological: Negative for dizziness and headaches.  Endo/Heme/Allergies: Does not bruise/bleed easily.  Psychiatric/Behavioral: Negative for depression. The patient is not nervous/anxious.     Cardiac Risk Factors include: advanced age (>57men, >34 women);diabetes mellitus     Objective:    Today's Vitals   05/07/20 0828  BP: (!) 140/58  Pulse: (!) 38  Temp: (!) 97.1 F (36.2 C)  TempSrc: Temporal  SpO2: 97%  Weight: 148 lb 8 oz (67.4 kg)  Height: 5' 10.5" (1.791 m)   Body mass index is 21.01 kg/m.  Advanced Directives 05/07/2020 12/30/2018 08/30/2016 03/28/2014  Does Patient Have a Medical Advance Directive? No No No No  Would patient like information on creating a medical advance directive? Yes (MAU/Ambulatory/Procedural Areas - Information given) - No - Patient declined No - patient declined information    Current Medications (verified) Outpatient Encounter Medications as of 05/07/2020  Medication Sig  . lisinopril (ZESTRIL) 2.5 MG tablet TAKE 1 TABLET BY MOUTH EVERY DAY  . metFORMIN (GLUCOPHAGE) 500 MG tablet TAKE 1 TABLET BY MOUTH TWICE A DAY WITH MEALS  . nitroGLYCERIN (NITROSTAT) 0.4 MG SL tablet PLACE 1 TABLET UNDER THE TONGUE EVERY 5 MINUTES AS NEEDED FOR CHEST PAIN  . rosuvastatin (CRESTOR) 20 MG tablet TAKE 1 TABLET BY MOUTH EVERY DAY  . [DISCONTINUED] clopidogrel (PLAVIX) 75 MG tablet TAKE 1 TABLET  BY MOUTH EVERY DAY  . [DISCONTINUED] tamsulosin (FLOMAX) 0.4 MG CAPS capsule Take 1 capsule (0.4 mg total) by mouth daily.   No facility-administered encounter medications on file as of 05/07/2020.    Allergies (verified) Patient has no known allergies.   History: Past Medical History:  Diagnosis Date  . CAD S/P percutaneous coronary angioplasty 03/28/2014   PCI to proximal and mid LAD: mLAD - Xience Alpine DES 2.5 mm x 18 mm (3.0 mm), pLAD Xience Alpine DES 2.75 mm 18 mm (3.0 mm distal and 3.6 mm proximal.  . Depression   . Diabetes mellitus type II, controlled (HCC)   . OA (osteoarthritis)    hip, back  . Tobacco abuse   . Unstable angina (HCC) 03/28/2014   DES x2 - p-mLAD   Past Surgical History:  Procedure Laterality Date  . EXCISIONAL HEMORRHOIDECTOMY  1980's  . INGUINAL HERNIA REPAIR Right ~ 2010  . LEFT HEART CATHETERIZATION WITH CORONARY ANGIOGRAM N/A 03/28/2014   Procedure: LEFT HEART CATHETERIZATION WITH CORONARY ANGIOGRAM;  Surgeon: Marykay Lex, MD;  Location: [facility] CATH LAB;  Service: CV: pLAD 95% (after SP1 & D1), mLAD 75%, Cx becomes bifurcating OM -> inferior branch 60%, downward takeoff RCA with mid 40-50%.  Marland Kitchen PERCUTANEOUS CORONARY STENT INTERVENTION (PCI-S)  03/28/2014   Procedure: PERCUTANEOUS CORONARY STENT INTERVENTION (PCI-S);  Surgeon: Marykay Lex, MD;  Location: [facility] CATH LAB;  Service: Cardiovascular;;  mLAD - Xience DES 2.5 mm x 18 mm (3.0 mm), pLAD Xience 2.75 mm x 18 mm (3.0 - 3.6 mm)   . TRANSTHORACIC ECHOCARDIOGRAM  06/2014  EF 60-65% - no RWMA.  Gr 1 DD. Mild LA & RA dilation   Family History  Problem Relation Age of Onset  . Diabetes Sister   . Heart disease Sister        died of MI  . Breast cancer Sister   . Stroke Sister        2 strokes   . Breast cancer Sister   . Heart disease Brother        multiple cardiac surgeries  . Heart disease Sister        stent  . Breast cancer Sister   . Heart disease Mother   . Heart disease Sister   .  Prostate cancer Neg Hx   . Colon cancer Neg Hx    Social History   Socioeconomic History  . Marital status: Divorced    Spouse name: Not on file  . Number of children: 2  . Years of education: Not on file  . Highest education level: Not on file  Occupational History  . Not on file  Tobacco Use  . Smoking status: Former Smoker    Packs/day: 1.50    Years: 46.00    Pack years: 69.00    Types: Cigarettes    Quit date: 05/08/2015    Years since quitting: 5.0  . Smokeless tobacco: Former Clinical biochemist  . Vaping Use: Some days  Substance and Sexual Activity  . Alcohol use: No    Alcohol/week: 0.0 standard drinks  . Drug use: No  . Sexual activity: Yes  Other Topics Concern  . 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 [place]      Family: 2 children - Trevor Wilkie and Misty Stanley - 2 grandsons and one on the way      Enjoys: hunt, golf, fish      Exercise: walking at work   Diet: limits red meat, chicken, limits junk food/bread      Safety   Seat belts: Yes    Guns: Yes  and secure   Safe in relationships: Yes    Social Determinants of Corporate investment banker Strain: Not on file  Food Insecurity: Not on file  Transportation Needs: Not on file  Physical Activity: Not on file  Stress: Not on file  Social Connections: Not on file    Tobacco Counseling Counseling given: Not Answered   Clinical Intake:  Pre-visit preparation completed: No  Pain : No/denies pain     BMI - recorded: 21 Nutritional Status: BMI of 19-24  Normal Nutritional Risks: None Diabetes: Yes CBG done?: No Did pt. bring in CBG monitor from home?: No  How often do you need to have someone help you when you read instructions, pamphlets, or other written materials from your doctor or pharmacy?: 1 - Never What is the last grade level you completed in school?: 11th grade  Diabetic? yes  Interpreter Needed?:  No      Activities of Daily Living In your present state of health, do you have any difficulty performing the following activities: 05/07/2020  Hearing? N  Vision? N  Difficulty concentrating or making decisions? N  Walking or climbing stairs? N  Dressing or bathing? N  Doing errands, shopping? N  Preparing Food and eating ? N  Using the Toilet? N  In the past six months, have you accidently leaked urine? N  Do you have problems with loss of bowel control? N  Managing your Medications? N  Managing your Finances? N  Housekeeping or managing your Housekeeping? N  Some recent data might be hidden    Patient Care Team: Lynnda Child, MD as PCP - General (Family Medicine) Marykay Lex, MD as PCP - Cardiology (Cardiology)  Indicate any recent Medical Services you may have received from other than [facility] providers in the past year (date may be approximate).     Assessment:   This is a routine wellness examination for Tony Lynch.  Hearing/Vision screen  Hearing Screening   125Hz  250Hz  500Hz  1000Hz  2000Hz  3000Hz  4000Hz  6000Hz  8000Hz   Right ear:  20 20 20 20  20     Left ear:  20 20 20 20  20     Vision Screening Comments: Last eye exam 09/2019  Dietary issues and exercise activities discussed: Current Exercise Habits: The patient has a physically strenuous job, but has no regular exercise apart from work., Exercise limited by: None identified  Goals    .  diabetes (pt-stated)      Maintain current weight      Depression Screen PHQ 2/9 Scores 05/07/2020 12/09/2017 05/05/2013  PHQ - 2 Score 0 0 4  PHQ- 9 Score - - 8    Fall Risk Fall Risk  05/07/2020 05/07/2020 12/09/2017 05/05/2013  Falls in the past year? 0 0 No No  Number falls in past yr: 0 0 - -  Injury with Fall? 0 - - -  Risk for fall due to : No Fall Risks - - -  Follow up Falls evaluation completed - - -    Cognitive Function:       Mini-Cog - 05/07/20 0857    Normal clock drawing test? yes    How many words  correct? 2              Immunizations Immunization History  Administered Date(s) Administered  . Influenza, High Dose Seasonal PF 01/07/2018, 12/29/2018  . Influenza, Seasonal, Injecte, Preservative Fre 02/06/2016  . Influenza,inj,Quad PF,6+ Mos 03/29/2014, 01/26/2015  . Pneumococcal Conjugate-13 12/09/2017  . Pneumococcal Polysaccharide-23 03/29/2014  . Tdap 06/02/2014, 08/30/2016    TDAP status: Up to date  Flu Vaccine status: Declined, Education has been provided regarding the importance of this vaccine but patient still declined. Advised may receive this vaccine at local pharmacy or Health Dept. Aware to provide a copy of the vaccination record if obtained from local pharmacy or Health Dept. Verbalized acceptance and understanding.  Pneumococcal vaccine status: Up to date  Covid-19 vaccine status: Declined, Education has been provided regarding the importance of this vaccine but patient still declined. Advised may receive this vaccine at local pharmacy or Health Dept.or vaccine clinic. Aware to provide a copy of the vaccination record if obtained from local pharmacy or Health Dept. Verbalized acceptance and understanding.  Qualifies for Shingles Vaccine? Yes   Zostavax completed Yes   Shingrix Completed?: No.    Education has been provided regarding the importance of this vaccine. Patient has been advised to call insurance company to determine out of pocket expense if they have not yet received this vaccine. Advised may also receive vaccine at local pharmacy or Health Dept. Verbalized acceptance and understanding.  Screening Tests Health Maintenance  Topic Date Due  . OPHTHALMOLOGY EXAM  Never done  . COLONOSCOPY (Pts 45-42yrs Insurance coverage will need to be confirmed)  Never done  . FOOT EXAM  12/10/2018  . PNA vac  Low Risk Adult (2 of 2 - PPSV23) 03/30/2019  . INFLUENZA VACCINE  11/27/2019  . HEMOGLOBIN A1C  09/13/2020  . TETANUS/TDAP  08/31/2026  . Hepatitis C  Screening  Completed  . COVID-19 Vaccine  Discontinued    Health Maintenance  Health Maintenance Due  Topic Date Due  . OPHTHALMOLOGY EXAM  Never done  . COLONOSCOPY (Pts 45-7636yrs Insurance coverage will need to be confirmed)  Never done  . FOOT EXAM  12/10/2018  . PNA vac Low Risk Adult (2 of 2 - PPSV23) 03/30/2019  . INFLUENZA VACCINE  11/27/2019    Colorectal cancer screening: Type of screening: Colonoscopy. Completed ~5. Repeat every Unknown will request years  Lung Cancer Screening: (Low Dose CT Chest recommended if Age 42-80 years, 30 pack-year currently smoking OR have quit w/in 15years.) does qualify.   Lung Cancer Screening Referral: declined  Additional Screening:  Hepatitis C Screening: does qualify; Completed 2017  Vision Screening: Recommended annual ophthalmology exams for early detection of glaucoma and other disorders of the eye. Is the patient up to date with their annual eye exam?  No  Who is the provider or what is the name of the office in which the patient attends annual eye exams? yes If pt is not established with a provider, would they like to be referred to a provider to establish care? n/a.   Dental Screening: Recommended annual dental exams for proper oral hygiene  Community Resource Referral / Chronic Care Management: CRR required this visit?  No   CCM required this visit?  No      Plan:     I have personally reviewed and noted the following in the patient's chart:   . Medical and social history . Use of alcohol, tobacco or illicit drugs  . Current medications and supplements . Functional ability and status . Nutritional status . Physical activity . Advanced directives . List of other physicians . Hospitalizations, surgeries, and ER visits in previous 12 months . Vitals . Screenings to include cognitive, depression, and falls . Referrals and appointments  In addition, I have reviewed and discussed with patient certain preventive  protocols, quality metrics, and best practice recommendations. A written personalized care plan for preventive services as well as general preventive health recommendations were provided to patient.     Lynnda ChildJessica R Robyn Galati, MD   05/07/2020

## 2020-05-07 NOTE — Assessment & Plan Note (Signed)
Pt notes hx of biopsy which was normal.

## 2020-05-07 NOTE — Patient Instructions (Addendum)
Call or MyChart  -- Eye doctor -- Colonoscopy doctor   Return sooner if you notice another 5-10 lb weight loss even with your regular eating    Tony Lynch , Thank you for taking time to come for your Medicare Wellness Visit. I appreciate your ongoing commitment to your health goals. Please review the following plan we discussed and let me know if I can assist you in the future.   These are the goals we discussed: Goals    .  diabetes (pt-stated)      Maintain current weight       This is a list of the screening recommended for you and due dates:  Health Maintenance  Topic Date Due  . Eye exam for diabetics  Never done  . Colon Cancer Screening  Never done  . Complete foot exam   12/10/2018  . Pneumonia vaccines (2 of 2 - PPSV23) 03/30/2019  . Flu Shot  11/27/2019  . Hemoglobin A1C  09/13/2020  . Tetanus Vaccine  08/31/2026  .  Hepatitis C: One time screening is recommended by Center for Disease Control  (CDC) for  adults born from [birth year] through 1963.   Completed  . COVID-19 Vaccine  Discontinued

## 2020-05-11 NOTE — Telephone Encounter (Signed)
Viewed schedule and chart. Pt had cpe with pcp 05/07/20.

## 2020-06-26 ENCOUNTER — Telehealth: Payer: Self-pay

## 2020-06-26 DIAGNOSIS — E785 Hyperlipidemia, unspecified: Secondary | ICD-10-CM

## 2020-06-26 MED ORDER — ROSUVASTATIN CALCIUM 20 MG PO TABS: 20.0000 mg | ORAL_TABLET | Freq: Every day | ORAL | 1 refills | Status: DC

## 2020-06-26 NOTE — Telephone Encounter (Signed)
Pharmacy requests refill on: Rosuvastatin 20 mg   LAST REFILL: 03/27/2020 (Q-90, R-0) LAST OV: 05/07/2020 NEXT OV: Not Scheduled  PHARMACY: [id number] [place], [place]

## 2020-09-26 DIAGNOSIS — Z0100 Encounter for examination of eyes and vision without abnormal findings: Secondary | ICD-10-CM | POA: Diagnosis not present

## 2020-09-26 DIAGNOSIS — H52223 Regular astigmatism, bilateral: Secondary | ICD-10-CM | POA: Diagnosis not present

## 2021-08-20 ENCOUNTER — Telehealth: Payer: Self-pay | Admitting: Family Medicine

## 2021-08-20 NOTE — Telephone Encounter (Signed)
N/A unable to leave a message for patient to call back to schedule Medicare Annual Wellness Visit  ? ?Last AWV  05/07/20 ? ? ?Any questions, please call me at [phone]  ?

## 2021-08-21 DIAGNOSIS — R300 Dysuria: Secondary | ICD-10-CM | POA: Diagnosis not present

## 2021-08-21 DIAGNOSIS — N410 Acute prostatitis: Secondary | ICD-10-CM | POA: Diagnosis not present

## 2021-08-21 DIAGNOSIS — B9689 Other specified bacterial agents as the cause of diseases classified elsewhere: Secondary | ICD-10-CM | POA: Diagnosis not present

## 2021-08-21 DIAGNOSIS — J019 Acute sinusitis, unspecified: Secondary | ICD-10-CM | POA: Diagnosis not present

## 2022-05-15 DIAGNOSIS — Z20818 Contact with and (suspected) exposure to other bacterial communicable diseases: Secondary | ICD-10-CM | POA: Diagnosis not present

## 2022-08-10 ENCOUNTER — Emergency Department
Admission: EM | Admit: 2022-08-10 | Discharge: 2022-08-10 | Disposition: A | Discharge status: Home / Self Care | Payer: Medicare HMO | Attending: Emergency Medicine | Admitting: Emergency Medicine

## 2022-08-10 ENCOUNTER — Other Ambulatory Visit: Payer: Self-pay

## 2022-08-10 DIAGNOSIS — I2510 Atherosclerotic heart disease of native coronary artery without angina pectoris: Secondary | ICD-10-CM | POA: Diagnosis not present

## 2022-08-10 DIAGNOSIS — I10 Essential (primary) hypertension: Secondary | ICD-10-CM | POA: Insufficient documentation

## 2022-08-10 DIAGNOSIS — R339 Retention of urine, unspecified: Secondary | ICD-10-CM | POA: Diagnosis not present

## 2022-08-10 DIAGNOSIS — E119 Type 2 diabetes mellitus without complications: Secondary | ICD-10-CM | POA: Diagnosis not present

## 2022-08-10 DIAGNOSIS — M19071 Primary osteoarthritis, right ankle and foot: Secondary | ICD-10-CM | POA: Diagnosis not present

## 2022-08-10 LAB — BASIC METABOLIC PANEL
Anion gap: 8 (ref 5–15)
BUN: 8 mg/dL (ref 8–23)
CO2: 27 mmol/L (ref 22–32)
Calcium: 9.1 mg/dL (ref 8.9–10.3)
Chloride: 100 mmol/L (ref 98–111)
Creatinine, Ser: 0.86 mg/dL (ref 0.61–1.24)
GFR, Estimated: >60 mL/min (ref >60)
Glucose, Bld: 190 mg/dL — ABNORMAL HIGH (ref 70–99)
Potassium: 3.8 mmol/L (ref 3.5–5.1)
Sodium: 135 mmol/L (ref 135–145)

## 2022-08-10 LAB — URINALYSIS, ROUTINE W REFLEX MICROSCOPIC
Bilirubin Urine: NEGATIVE
Glucose, UA: NEGATIVE mg/dL
Ketones, ur: NEGATIVE mg/dL
Leukocytes,Ua: NEGATIVE
Nitrite: NEGATIVE
Protein, ur: NEGATIVE mg/dL
Specific Gravity, Urine: 1.006 (ref 1.005–1.030)
Squamous Epithelial / HPF: NONE SEEN /HPF (ref 0–5)
pH: 6 (ref 5.0–8.0)

## 2022-08-10 LAB — CBC
HCT: 45.1 % (ref 39.0–52.0)
Hemoglobin: 14.7 g/dL (ref 13.0–17.0)
MCH: 28.8 pg (ref 26.0–34.0)
MCHC: 32.6 g/dL (ref 30.0–36.0)
MCV: 88.4 fL (ref 80.0–100.0)
Platelets: 349 10*3/uL (ref 150–400)
RBC: 5.1 MIL/uL (ref 4.22–5.81)
RDW: 14.4 % (ref 11.5–15.5)
WBC: 9.2 10*3/uL (ref 4.0–10.5)
nRBC: 0 % (ref 0.0–0.2)

## 2022-08-10 NOTE — ED Triage Notes (Signed)
Pt to ED via POV from home. Pt reports urinary retention since this morning. Pt reports lower abdominal pain. Pt reports this has happened twice before and had catheter placed 1st time and was placed on antibiotics the second time.

## 2022-08-10 NOTE — ED Provider Notes (Signed)
[facility] Provider Note    Event Date/Time   First MD Initiated Contact with Patient 08/10/22 [phone]     (approximate)   History   Chief Complaint Urinary Retention   HPI  Tony Lynch is a 72 y.o. male with past medical history of hypertension, hyperlipidemia, diabetes, and CAD who presents to the ED complaining of urinary retention.  Patient reports that he last urinated around 4:00 this morning, has not been able to pass any urine since then.  He describes increasing pain in his suprapubic area, also feels like this area is slightly distended.  He reports being able to urinate normally around 10 PM last night, had tried to drink some coffee to make himself urinate more.  He denies any recent dysuria, hematuria, fever, or flank pain.  He does report dealing with urinary retention in the past and has previously required Foley catheter placement.     Physical Exam   Triage Vital Signs: ED Triage Vitals  Enc Vitals Group     BP 08/10/22 0832 (!) 153/95     Pulse Rate 08/10/22 0829 60     Resp 08/10/22 0829 18     Temp 08/10/22 0829 97.8 F (36.6 C)     Temp src --      SpO2 08/10/22 0829 99 %     Weight --      Height --      Head Circumference --      Peak Flow --      Pain Score 08/10/22 0829 4     Pain Loc --      Pain Edu? --      Excl. in GC? --     Most recent vital signs: Vitals:   08/10/22 0829 08/10/22 0832  BP:  (!) 153/95  Pulse: 60   Resp: 18   Temp: 97.8 F (36.6 C)   SpO2: 99%     Constitutional: Alert and oriented. Eyes: Conjunctivae are normal. Head: Atraumatic. Nose: No congestion/rhinnorhea. Mouth/Throat: Mucous membranes are moist.  Cardiovascular: Normal rate, regular rhythm. Grossly normal heart sounds.  2+ radial pulses bilaterally. Respiratory: Normal respiratory effort.  No retractions. Lungs CTAB. Gastrointestinal: Soft and tender to palpation in the suprapubic area with mild distention. Musculoskeletal:  No lower extremity tenderness nor edema.  Neurologic:  Normal speech and language. No gross focal neurologic deficits are appreciated.    ED Results / Procedures / Treatments   Labs (all labs ordered are listed, but only abnormal results are displayed) Labs Reviewed  URINALYSIS, ROUTINE W REFLEX MICROSCOPIC - Abnormal; Notable for the following components:      Result Value   Color, Urine YELLOW (*)    APPearance CLEAR (*)    Hgb urine dipstick MODERATE (*)    Bacteria, UA RARE (*)    All other components within normal limits  BASIC METABOLIC PANEL - Abnormal; Notable for the following components:   Glucose, Bld 190 (*)    All other components within normal limits  CBC    PROCEDURES:  Critical Care performed: No  Procedures   MEDICATIONS ORDERED IN ED: Medications - No data to display   IMPRESSION / MDM / ASSESSMENT AND PLAN / ED COURSE  I reviewed the triage vital signs and the nursing notes.                              72 y.o. male with  past medical history of hypertension, hyperlipidemia, diabetes, and CAD who presents to the ED with increasing suprapubic discomfort after being unable to urinate for the past 5 hours.  Patient's presentation is most consistent with acute presentation with potential threat to life or bodily function.  Differential diagnosis includes, but is not limited to, urinary retention, UTI, hematuria, AKI, electrolyte abnormality.  Patient well-appearing and in no acute distress, vital signs are unremarkable.  He does have suprapubic tenderness to palpation with some mild distention likely secondary to acute urinary retention.  We will check bladder scan but patient will likely require Foley catheter placement for decompression.  Labs and urinalysis are pending at this time.  Patient with significant urinary retention noted on bladder scan, Foley catheter was placed with return of greater than 800 cc of urine.  Patient reports feeling much  better with resolution of his symptoms.  Labs are reassuring with no significant anemia, leukocytosis, electrolyte abnormality, or AKI.  Urinalysis shows no signs of infection.  Patient appropriate for discharge home with urology follow-up, was counseled to return to the ED for new or worsening symptoms.  Patient agrees with plan.      FINAL CLINICAL IMPRESSION(S) / ED DIAGNOSES   Final diagnoses:  Urinary retention     Rx / DC Orders   ED Discharge Orders     None        Note:  This document was prepared using Dragon voice recognition software and may include unintentional dictation errors.   Chesley Noon, MD 08/10/22 [phone]

## 2022-08-10 NOTE — ED Notes (Signed)
Pt drank three cups of coffee which normally helps him to pee.

## 2022-08-13 ENCOUNTER — Telehealth: Payer: Self-pay

## 2022-08-13 NOTE — Telephone Encounter (Signed)
     Patient  visit on 4/14  at [place]   Have you been able to follow up with your primary care physician? Yes   The patient was or was not able to obtain any needed medicine or equipment. Yes   Are there diet recommendations that you are having difficulty following? Na   Patient expresses understanding of discharge instructions and education provided has no other needs at this time.  Yes      Renarda Mullinix [facility], [place] [phone] [address], [place], [place] [address] Phone: [phone] Email: [email or web address]    

## 2022-08-26 ENCOUNTER — Ambulatory Visit: Payer: Medicare HMO | Admitting: Urology

## 2022-08-26 ENCOUNTER — Encounter: Payer: Self-pay | Admitting: Urology

## 2022-08-26 VITALS — BP 149/74 | HR 66 | Ht 73.0 in | Wt 145.0 lb

## 2022-08-26 DIAGNOSIS — R1030 Lower abdominal pain, unspecified: Secondary | ICD-10-CM | POA: Insufficient documentation

## 2022-08-26 DIAGNOSIS — R339 Retention of urine, unspecified: Secondary | ICD-10-CM | POA: Diagnosis not present

## 2022-08-26 DIAGNOSIS — R972 Elevated prostate specific antigen [PSA]: Secondary | ICD-10-CM | POA: Diagnosis not present

## 2022-08-26 DIAGNOSIS — R140 Abdominal distension (gaseous): Secondary | ICD-10-CM | POA: Insufficient documentation

## 2022-08-26 DIAGNOSIS — Z466 Encounter for fitting and adjustment of urinary device: Secondary | ICD-10-CM | POA: Diagnosis not present

## 2022-08-26 LAB — URINALYSIS, ROUTINE W REFLEX MICROSCOPIC
Bilirubin Urine: NEGATIVE
Glucose, UA: NEGATIVE mg/dL
Ketones, ur: NEGATIVE mg/dL
Leukocytes,Ua: NEGATIVE
Nitrite: NEGATIVE
Protein, ur: NEGATIVE mg/dL
Specific Gravity, Urine: 1.005 (ref 1.005–1.030)
Squamous Epithelial / HPF: NONE SEEN /HPF (ref 0–5)
pH: 7 (ref 5.0–8.0)

## 2022-08-26 LAB — BLADDER SCAN AMB NON-IMAGING

## 2022-08-26 MED ORDER — CEPHALEXIN 250 MG PO CAPS
500.0000 mg | ORAL_CAPSULE | Freq: Once | ORAL | Status: AC
Start: 2022-08-26 — End: 2022-08-26
  Administered 2022-08-26: 500 mg via ORAL

## 2022-08-26 MED ORDER — TAMSULOSIN HCL 0.4 MG PO CAPS: 0.4000 mg | ORAL_CAPSULE | Freq: Every day | ORAL | 11 refills | Status: DC

## 2022-08-26 NOTE — ED Triage Notes (Signed)
Pt had foley cath removed this morning and has only had 3 episodes of urination since.  C/o pain and feeling of urgency, but unable to get flow started. Took Flomax apx 5hrs without any relief in sx.

## 2022-08-26 NOTE — Progress Notes (Signed)
Catheter Removal  Patient is present today for a catheter removal. 10ml of water was drained from the balloon. A 16FR foley cath was removed from the bladder, no complications were noted. Patient tolerated well. Per verbal order from Dr. Richardo Hanks patient given one time dose of Keflex 500mg  by mouth.   Performed by: Debbe Bales, CMA  Follow up/ Additional notes: RTC this afternoon for PVR

## 2022-08-26 NOTE — ED Notes (Signed)
Bladder scan complete. Showing >798ML. MD Roxan Hockey made aware

## 2022-08-26 NOTE — ED Notes (Signed)
Foley cath inserted per MD Siadecki verbal order. Immediate Urine return of apx . Pt voices improvement of discomfort

## 2022-08-26 NOTE — Progress Notes (Signed)
08/26/22 8:34 AM   Tony Lynch [birth year] [id number]  CC: Urinary retention, history of elevated PSA  HPI: 72 year old male with extensive cardiac history who I previously followed for an elevated PSA.  PSA values in 2019 were elevated at 16.4 and 11.6, and he underwent a prostate biopsy on 01/26/2018 showing an 86 g prostate with only benign tissue.  I recommended 3 to 39-month follow-up with repeat PSA and consideration of prostate MRI in the future, but he never followed up.  PSAs were monitored by PCP and were stable, most recently 12 in November 2021.  He presented to the ER with inability to urinate on 08/10/2022, and a Foley catheter was placed with urine output.  Urinalysis was benign, renal function was normal, and he was discharged with urology follow-up.  He was not started on Flomax.  He would like to have the Foley removed today.  He denies any urinary symptoms leading up to this event.  He does have a history of retention requiring catheter a few years ago as well.  He reports about 30 pounds of weight loss over the last 3 years, but this has been intentional with significant changes in diet after being told he had prediabetes.   PMH: Past Medical History:  Diagnosis Date   CAD S/P percutaneous coronary angioplasty 03/28/2014   PCI to proximal and mid LAD: mLAD - Xience Alpine DES 2.5 mm x 18 mm (3.0 mm), pLAD Xience Alpine DES 2.75 mm 18 mm (3.0 mm distal and 3.6 mm proximal.   Depression    Diabetes mellitus type II, controlled (HCC)    OA (osteoarthritis)    hip, back   Tobacco abuse    Unstable angina (HCC) 03/28/2014   DES x2 - p-mLAD    Surgical History: Past Surgical History:  Procedure Laterality Date   EXCISIONAL HEMORRHOIDECTOMY  1980's   INGUINAL HERNIA REPAIR Right ~ 2010   LEFT HEART CATHETERIZATION WITH CORONARY ANGIOGRAM N/A 03/28/2014   Procedure: LEFT HEART CATHETERIZATION WITH CORONARY ANGIOGRAM;  Surgeon: Marykay Lex, MD;  Location: [facility]  CATH LAB;  Service: CV: pLAD 95% (after SP1 & D1), mLAD 75%, Cx becomes bifurcating OM -> inferior branch 60%, downward takeoff RCA with mid 40-50%.   PERCUTANEOUS CORONARY STENT INTERVENTION (PCI-S)  03/28/2014   Procedure: PERCUTANEOUS CORONARY STENT INTERVENTION (PCI-S);  Surgeon: Marykay Lex, MD;  Location: [facility] CATH LAB;  Service: Cardiovascular;;  mLAD - Xience DES 2.5 mm x 18 mm (3.0 mm), pLAD Xience 2.75 mm x 18 mm (3.0 - 3.6 mm)    TRANSTHORACIC ECHOCARDIOGRAM  06/2014   EF 60-65% - no RWMA.  Gr 1 DD. Mild LA & RA dilation     Family History: Family History  Problem Relation Age of Onset   Diabetes Sister    Heart disease Sister        died of MI   Breast cancer Sister    Stroke Sister        2 strokes    Breast cancer Sister    Heart disease Brother        multiple cardiac surgeries   Heart disease Sister        stent   Breast cancer Sister    Heart disease Mother    Heart disease Sister    Prostate cancer Neg Hx    Colon cancer Neg Hx     Social History:  reports that he quit smoking about 7 years ago. His smoking use  included cigarettes. He has a 69.00 pack-year smoking history. He has quit using smokeless tobacco. He reports that he does not drink alcohol and does not use drugs.  Physical Exam: BP (!) 149/74 (BP Location: Left Arm, Patient Position: Sitting, Cuff Size: Normal)   Pulse 66   Ht 6\' 1"  (1.854 m)   Wt 145 lb (65.8 kg)   BMI 19.13 kg/m    Constitutional:  Alert and oriented, No acute distress. Cardiovascular: No clubbing, cyanosis, or edema. Respiratory: Normal respiratory effort, no increased work of breathing. GI: Abdomen is soft, nontender, nondistended, no abdominal masses  Laboratory Data: See HPI  Assessment & Plan:   72 year old male with long history of elevated PSA, including 16.4 in 2019 when he underwent a biopsy showing an 86 g prostate with only benign tissue.  PSA has been stable since that time ranging from 9-12.  Recently  developed urinary retention thought to be secondary to BPH and Foley catheter placed in ER.  Foley was removed in clinic today, Keflex given x 2 for prophylaxis, he has been able to urinate today multiple times, bladder scan this afternoon .  He has not yet started Flomax, and this was sent in this afternoon.  We discussed options including catheter placement, consideration of outlet procedures, or trial of Flomax.  He would like to trial Flomax before considering any more invasive treatments.  Reassurance provided regarding stable PSA with history of negative biopsy.  Start Flomax RTC 4 to 6 weeks PVR, sooner if problems   Legrand Rams, MD 08/26/2022  [facility] [address] [place], [place] [address] [phone]

## 2022-08-27 ENCOUNTER — Emergency Department
Admission: EM | Admit: 2022-08-27 | Discharge: 2022-08-27 | Disposition: A | Discharge status: Home / Self Care | Payer: Medicare HMO | Attending: Emergency Medicine | Admitting: Emergency Medicine

## 2022-08-27 DIAGNOSIS — R339 Retention of urine, unspecified: Secondary | ICD-10-CM

## 2022-08-27 NOTE — ED Notes (Signed)
Pt DC to home. Dc instructions reviewed with all questions answered. Pt voiced understanding. Pt ambulatory out of dept with steady gait

## 2022-08-27 NOTE — ED Provider Notes (Signed)
[facility] Provider Note    Event Date/Time   First MD Initiated Contact with Patient 08/27/22 0014     (approximate)      Urinary Retention   HPI  Tony Lynch is a 72 y.o. male here with urinary retention.  The patient has a history of BPH with chronic recurrent retention.  He just had the Foley catheter removed in the office today.  He states he went to work and peed several times and returned back to the clinic and still had a small amount retaining but he thought he could pee it out.  He states that since then, has been unable to pee so he had progressively worsening, severe, abdominal distention and lower abdominal pain.  He presents for evaluation.  Greater than 700 cc in the waiting room and had a Foley catheter placed with resolution of symptoms.  Now feels back to baseline.  Denies any fevers or chills.  No other complaints.  Of note, he just started taking his Flomax this afternoon.     Physical Exam   Triage Vital Signs: ED Triage Vitals [08/26/22 2119]  Enc Vitals Group     BP (!) 146/75     Pulse Rate (!) 135     Resp 19     Temp 98.2 F (36.8 C)     Temp Source Oral     SpO2 99 %     Weight 145 lb (65.8 kg)     Height 6\' 1"  (1.854 m)     Head Circumference      Peak Flow      Pain Score 10     Pain Loc      Pain Edu?      Excl. in GC?     Most recent vital signs: Vitals:   08/26/22 2119 08/27/22 0038  BP: (!) 146/75 129/66  Pulse: (!) 135 85  Resp: 19 16  Temp: 98.2 F (36.8 C)   SpO2: 99% 99%     General: Awake, no distress.  CV:  Good peripheral perfusion.  Resp:  Normal work of breathing.  Abd:  No distention.  No abdominal pain or tenderness.  Foley catheter in place, draining clear yellow urine. Other:  No CVA tenderness.   ED Results / Procedures / Treatments   Labs (all labs ordered are listed, but only abnormal results are displayed) Labs Reviewed  URINALYSIS, ROUTINE W REFLEX MICROSCOPIC - Abnormal;  Notable for the following components:      Result Value   Color, Urine STRAW (*)    APPearance CLEAR (*)    Hgb urine dipstick MODERATE (*)    Bacteria, UA RARE (*)    All other components within normal limits     EKG    RADIOLOGY    I also independently reviewed and agree with radiologist interpretations.   PROCEDURES:  Critical Care performed: No   MEDICATIONS ORDERED IN ED: Medications - No data to display   IMPRESSION / MDM / ASSESSMENT AND PLAN / ED COURSE  I reviewed the triage vital signs and the nursing notes.                              Differential diagnosis includes, but is not limited to, recurrent urinary retention from BPH, medication effect, UTI, prostatitis  Patient's presentation is most consistent with acute presentation with potential threat to life or bodily function.  72  yo M here with recurrent urinary retention after foley removed in urology clinic today. Unfortunately he had not started his flomax until this afternoon. Repeat scan shows recurrent retention, resolved with foley placement today. Feels completely better. UOP normal with no signs of AKI. UA without evidence of infection. Will d/c with instructions to adhere to flomax regimen and f/u wth urology as outpatient.     FINAL CLINICAL IMPRESSION(S) / ED DIAGNOSES   Final diagnoses:  Urinary retention     Rx / DC Orders   ED Discharge Orders     None        Note:  This document was prepared using Dragon voice recognition software and may include unintentional dictation errors.   Shaune Pollack, MD 08/27/22 [phone]

## 2022-09-15 NOTE — Progress Notes (Unsigned)
09/16/2022 8:11 AM   Tony Lynch 01-12-1951 [id number]  Referring provider: Gweneth Dimitri, MD [address] [place],  [place] [address]  Urological history: 1. Elevated PSA -PSA (2019) 16.4 - biopsy   2. BPH with retention -prostate volume 89 gram prostate  -tamsulosin 0.4 mg daily  No chief complaint on file.   HPI: Tony Lynch is a 72 y.o. male who presents today for trial of void.   Previous records reviewed.   He had issues with elevated PSA back in 2019 and underwent a prostate biopsy which revealed benign tissue and a prostate volume 89 g.  He was instructed to follow-up in 3 to 4 months which he did not.  His PCP has been following his PSAs and they have been stable.  He then returned to our office in August 26, 2022 after he had an episode of urinary retention managed by the emergency room and a Foley was placed.  He was started on tamsulosin 0.4 mg at that visit.  He also returned that afternoon and bladder scan revealed a PVR of 196 mL.  Later that evening, he was seen in the emergency department and urinary retention.  Foley catheter was placed and he was instructed to continue tamsulosin 0.4 mg daily.  Foley catheter is removed this am.    PMH: Past Medical History:  Diagnosis Date   CAD S/P percutaneous coronary angioplasty 03/28/2014   PCI to proximal and mid LAD: mLAD - Xience Alpine DES 2.5 mm x 18 mm (3.0 mm), pLAD Xience Alpine DES 2.75 mm 18 mm (3.0 mm distal and 3.6 mm proximal.   Depression    Diabetes mellitus type II, controlled (HCC)    OA (osteoarthritis)    hip, back   Tobacco abuse    Unstable angina (HCC) 03/28/2014   DES x2 - p-mLAD    Surgical History: Past Surgical History:  Procedure Laterality Date   EXCISIONAL HEMORRHOIDECTOMY  1980's   INGUINAL HERNIA REPAIR Right ~ 2010   LEFT HEART CATHETERIZATION WITH CORONARY ANGIOGRAM N/A 03/28/2014   Procedure: LEFT HEART CATHETERIZATION WITH CORONARY ANGIOGRAM;  Surgeon: Marykay Lex, MD;  Location: [facility] CATH LAB;  Service: CV: pLAD 95% (after SP1 & D1), mLAD 75%, Cx becomes bifurcating OM -> inferior branch 60%, downward takeoff RCA with mid 40-50%.   PERCUTANEOUS CORONARY STENT INTERVENTION (PCI-S)  03/28/2014   Procedure: PERCUTANEOUS CORONARY STENT INTERVENTION (PCI-S);  Surgeon: Marykay Lex, MD;  Location: [facility] CATH LAB;  Service: Cardiovascular;;  mLAD - Xience DES 2.5 mm x 18 mm (3.0 mm), pLAD Xience 2.75 mm x 18 mm (3.0 - 3.6 mm)    TRANSTHORACIC ECHOCARDIOGRAM  06/2014   EF 60-65% - no RWMA.  Gr 1 DD. Mild LA & RA dilation    Home Medications:  Allergies as of 09/16/2022   No Known Allergies      Medication List        Accurate as of Sep 15, 2022  8:11 AM. If you have any questions, ask your nurse or doctor.          lisinopril 2.5 MG tablet Commonly known as: ZESTRIL Take 1 tablet (2.5 mg total) by mouth daily.   metFORMIN 500 MG tablet Commonly known as: GLUCOPHAGE Take 1 tablet (500 mg total) by mouth 2 (two) times daily with a meal.   nitroGLYCERIN 0.4 MG SL tablet Commonly known as: NITROSTAT PLACE 1 TABLET UNDER THE TONGUE EVERY 5 MINUTES AS NEEDED  FOR CHEST PAIN   rosuvastatin 20 MG tablet Commonly known as: CRESTOR Take 1 tablet (20 mg total) by mouth daily.   tamsulosin 0.4 MG Caps capsule Commonly known as: FLOMAX Take 1 capsule (0.4 mg total) by mouth daily.        Allergies: No Known Allergies  Family History: Family History  Problem Relation Age of Onset   Diabetes Sister    Heart disease Sister        died of MI   Breast cancer Sister    Stroke Sister        2 strokes    Breast cancer Sister    Heart disease Brother        multiple cardiac surgeries   Heart disease Sister        stent   Breast cancer Sister    Heart disease Mother    Heart disease Sister    Prostate cancer Neg Hx    Colon cancer Neg Hx     Social History:  reports that he quit smoking about 7 years ago. His smoking use included  cigarettes. He has a 69.00 pack-year smoking history. He has been exposed to tobacco smoke. He has quit using smokeless tobacco. He reports that he does not drink alcohol and does not use drugs.  ROS: Pertinent ROS in HPI  Physical Exam: There were no vitals taken for this visit.  Constitutional:  Well nourished. Alert and oriented, No acute distress. HEENT: [place] AT, moist mucus membranes.  Trachea midline, no masses. Cardiovascular: No clubbing, cyanosis, or edema. Respiratory: Normal respiratory effort, no increased work of breathing. GI: Abdomen is soft, non tender, non distended, no abdominal masses. Liver and spleen not palpable.  No hernias appreciated.  Stool sample for occult testing is not indicated.   GU: No CVA tenderness.  No bladder fullness or masses.  Patient with circumcised/uncircumcised phallus. ***Foreskin easily retracted***  Urethral meatus is patent.  No penile discharge. No penile lesions or rashes. Scrotum without lesions, cysts, rashes and/or edema.  Testicles are located scrotally bilaterally. No masses are appreciated in the testicles. Left and right epididymis are normal. Rectal: Patient with  normal sphincter tone. Anus and perineum without scarring or rashes. No rectal masses are appreciated. Prostate is approximately *** grams, *** nodules are appreciated. Seminal vesicles are normal. Skin: No rashes, bruises or suspicious lesions. Lymph: No cervical or inguinal adenopathy. Neurologic: Grossly intact, no focal deficits, moving all 4 extremities. Psychiatric: Normal mood and affect.  Laboratory Data: Lab Results  Component Value Date   WBC 9.2 08/10/2022   HGB 14.7 08/10/2022   HCT 45.1 08/10/2022   MCV 88.4 08/10/2022   PLT 349 08/10/2022    Lab Results  Component Value Date   CREATININE 0.86 08/10/2022  I have reviewed the labs.   Pertinent Imaging: ***  Assessment & Plan:  ***  1. BPH with retention -Foley removed this a.m. for voiding  trial ***  2. Elevated PSA -biopsy in 2019 negative -PSA's have been stable   No follow-ups on file.  These notes generated with voice recognition software. I apologize for typographical errors.  Cloretta Ned  [facility] [address]  [address] [place], [place] [address] [phone]

## 2022-09-16 ENCOUNTER — Other Ambulatory Visit: Payer: Self-pay

## 2022-09-16 ENCOUNTER — Emergency Department
Admission: EM | Admit: 2022-09-16 | Discharge: 2022-09-16 | Disposition: A | Discharge status: Home / Self Care | Payer: Medicare HMO | Attending: Emergency Medicine | Admitting: Emergency Medicine

## 2022-09-16 ENCOUNTER — Encounter: Payer: Self-pay | Admitting: Urology

## 2022-09-16 ENCOUNTER — Encounter: Payer: Self-pay | Admitting: Intensive Care

## 2022-09-16 ENCOUNTER — Ambulatory Visit: Payer: Medicare HMO | Admitting: Urology

## 2022-09-16 VITALS — BP 159/67 | HR 76 | Ht 72.0 in | Wt 145.0 lb

## 2022-09-16 DIAGNOSIS — R972 Elevated prostate specific antigen [PSA]: Secondary | ICD-10-CM

## 2022-09-16 DIAGNOSIS — N401 Enlarged prostate with lower urinary tract symptoms: Secondary | ICD-10-CM

## 2022-09-16 DIAGNOSIS — N138 Other obstructive and reflux uropathy: Secondary | ICD-10-CM

## 2022-09-16 DIAGNOSIS — R339 Retention of urine, unspecified: Secondary | ICD-10-CM | POA: Diagnosis not present

## 2022-09-16 LAB — URINALYSIS, ROUTINE W REFLEX MICROSCOPIC
Bacteria, UA: NONE SEEN
Bilirubin Urine: NEGATIVE
Glucose, UA: NEGATIVE mg/dL
Ketones, ur: NEGATIVE mg/dL
Nitrite: NEGATIVE
Protein, ur: NEGATIVE mg/dL
Specific Gravity, Urine: 1.003 — ABNORMAL LOW (ref 1.005–1.030)
Squamous Epithelial / HPF: NONE SEEN /HPF (ref 0–5)
pH: 6 (ref 5.0–8.0)

## 2022-09-16 LAB — BLADDER SCAN AMB NON-IMAGING: Scan Result: 296

## 2022-09-16 MED ORDER — TAMSULOSIN HCL 0.4 MG PO CAPS: 0.8000 mg | ORAL_CAPSULE | Freq: Every day | ORAL | 3 refills | Status: DC

## 2022-09-16 MED ORDER — CEPHALEXIN 500 MG PO CAPS: 500.0000 mg | ORAL_CAPSULE | Freq: Two times a day (BID) | ORAL | 0 refills | Status: AC

## 2022-09-16 MED ORDER — CEPHALEXIN 500 MG PO CAPS
500.0000 mg | ORAL_CAPSULE | Freq: Once | ORAL | Status: AC
  Administered 2022-09-16: 500 mg via ORAL
  Filled 2022-09-16: qty 1

## 2022-09-16 NOTE — ED Provider Notes (Signed)
[facility] Provider Note    Event Date/Time   First MD Initiated Contact with Patient 09/16/22 1900     (approximate)   History   Urinary Retention   HPI  Tony Lynch is a 72 y.o. male past medical history significant for BPH with recurrent urinary retention, who presents to the emergency department with urinary retention.  Was just seen at urology earlier today and had his Foley catheter removed.  Was told that he likely needed to keep it in but he stated that he wanted a break.  Approximately 2 hours later endorses urinary retention and lower abdominal pain.  Severe pain that caused him to come to the emergency department.  Denies any fever or chills.  No nausea or vomiting.  Foley catheter was placed and states he feels much better after Foley catheter was placed.     Physical Exam   Triage Vital Signs: ED Triage Vitals  Enc Vitals Group     BP 09/16/22 1835 (!) 191/100     Pulse Rate 09/16/22 1835 79     Resp 09/16/22 1835 19     Temp 09/16/22 1835 98 F (36.7 C)     Temp Source 09/16/22 1835 Oral     SpO2 09/16/22 1835 95 %     Weight 09/16/22 1837 145 lb (65.8 kg)     Height 09/16/22 1837 6' (1.829 m)     Head Circumference --      Peak Flow --      Pain Score 09/16/22 1837 10     Pain Loc --      Pain Edu? --      Excl. in GC? --     Most recent vital signs: Vitals:   09/16/22 1835 09/16/22 1958  BP: (!) 191/100 136/66  Pulse: 79 (!) 55  Resp: 19 20  Temp: 98 F (36.7 C)   SpO2: 95% 95%    Physical Exam Constitutional:      Appearance: He is well-developed.  HENT:     Head: Atraumatic.  Eyes:     Conjunctiva/sclera: Conjunctivae normal.  Cardiovascular:     Rate and Rhythm: Regular rhythm.  Pulmonary:     Effort: No respiratory distress.  Genitourinary:    Comments: Foley catheter in place Musculoskeletal:     Cervical back: Normal range of motion.  Skin:    General: Skin is warm.  Neurological:     Mental  Status: He is alert. Mental status is at baseline.      IMPRESSION / MDM / ASSESSMENT AND PLAN / ED COURSE  I reviewed the triage vital signs and the nursing notes.  Differential diagnosis including urinary retention from BPH, urinary tract infection, prostate cancer  On chart review patient has had a biopsy and had negative prostate cancer.  Recurrent episodes of urinary retention and is followed by urology.   Labs (all labs ordered are listed, but only abnormal results are displayed) Labs interpreted as -    Labs Reviewed  URINALYSIS, ROUTINE W REFLEX MICROSCOPIC - Abnormal; Notable for the following components:      Result Value   Color, Urine STRAW (*)    APPearance CLEAR (*)    Specific Gravity, Urine 1.003 (*)    Hgb urine dipstick LARGE (*)    Leukocytes,Ua TRACE (*)    All other components within normal limits      Foley catheter was placed with significant urine output of approximately 700 cc.  Will obtain a UA to evaluate for possible urinary tract infection given his retention.  Foley catheter was placed discussed close follow-up with urology as an outpatient.  UA consistent with possible urinary tract infection.  Urine was sent for culture.  Given first dose of Keflex in the emergency department.  On chart review no history of a resistant urinary tract infection.  Will schedule Keflex.  Given return precautions.   PROCEDURES:  Critical Care performed: No  Procedures  Patient's presentation is most consistent with acute presentation with potential threat to life or bodily function.   MEDICATIONS ORDERED IN ED: Medications  cephALEXin (KEFLEX) capsule 500 mg (500 mg Oral Given 09/16/22 2127)    FINAL CLINICAL IMPRESSION(S) / ED DIAGNOSES   Final diagnoses:  Urinary retention     Rx / DC Orders   ED Discharge Orders          Ordered    cephALEXin (KEFLEX) 500 MG capsule  2 times daily        09/16/22 2130             Note:  This document  was prepared using Dragon voice recognition software and may include unintentional dictation errors.   Corena Herter, MD 09/16/22 2131

## 2022-09-16 NOTE — Progress Notes (Signed)
Catheter Removal  Patient is present today for a catheter removal.  9 ml of water was drained from the balloon. A 16FR foley cath was removed from the bladder, no complications were noted. Patient tolerated well.  Performed by: Randa Lynn, RMA  Follow up/ Additional notes: No follow-ups on file.

## 2022-09-16 NOTE — ED Triage Notes (Signed)
Patient has had trouble urinating since April. Seen on 08/27/22 for same

## 2022-09-16 NOTE — Discharge Instructions (Addendum)
You had findings concerning for possible urinary tract infection.  Your urine was sent for culture.  You were started on antibiotics, it is important that you take your first dose tomorrow morning.  Follow-up with your urologist.

## 2022-09-16 NOTE — ED Provider Triage Note (Signed)
Emergency Medicine Provider Triage Evaluation Note  Tony Lynch , a 72 y.o. male  was evaluated in triage.  Pt complains of urinary retention. Has had off and on catheters over the last week. Has BPH. Unable to urinate.  Review of Systems  Positive: Urinary retention Negative: Fever, back pain, emesis  Physical Exam  There were no vitals taken for this visit. Gen:   Awake, no distress   Resp:  Normal effort  MSK:   Moves extremities without difficulty  Other:    Medical Decision Making  Medically screening exam initiated at 6:34 PM.  Appropriate orders placed.  [name] was informed that the remainder of the evaluation will be completed by another provider, this initial triage assessment does not replace that evaluation, and the importance of remaining in the ED until their evaluation is complete.  Bladder scan, foley cath, urinalysis   Racheal Patches, PA-C 09/16/22 1834

## 2022-09-18 ENCOUNTER — Telehealth: Payer: Self-pay | Admitting: Urology

## 2022-09-18 NOTE — Telephone Encounter (Signed)
He is back in the emergency room last night and urinary retention.  I think at this time, we should go ahead and schedule a cystoscopy with Dr. Richardo Hanks with him.

## 2022-09-23 NOTE — Telephone Encounter (Signed)
Called advised him on recommendations for cysto, pt voiced firmly that he does not want surgery and therefore does not see the need for cystoscopy at this time. Advised pt to nature and purpose of cysto, pt voiced understanding and states that he does not wish to proceed with cysto at this time. He wishes to have another voiding trial. Pt scheduled for 2 part V&T.

## 2022-09-30 ENCOUNTER — Ambulatory Visit: Payer: Medicare HMO | Admitting: Physician Assistant

## 2022-09-30 ENCOUNTER — Ambulatory Visit: Payer: Medicare HMO | Admitting: Urology

## 2022-10-07 ENCOUNTER — Ambulatory Visit: Payer: Medicare HMO | Admitting: Urology

## 2022-10-07 ENCOUNTER — Ambulatory Visit: Payer: Medicare HMO | Admitting: Physician Assistant

## 2022-10-07 ENCOUNTER — Other Ambulatory Visit: Payer: Medicare HMO | Admitting: Urology

## 2022-10-07 DIAGNOSIS — R339 Retention of urine, unspecified: Secondary | ICD-10-CM

## 2022-10-07 LAB — BLADDER SCAN AMB NON-IMAGING: Scan Result: 107

## 2022-10-07 NOTE — Progress Notes (Signed)
10/07/2022 3:14 PM   Tony Lynch 1951-02-23 [id number]  CC: Chief Complaint  Patient presents with   Urinary Retention   HPI: Tony Lynch is a 72 y.o. male with PMH BPH with recurrent urinary retention on Flomax 0.8 mg daily and elevated PSA with benign biopsy in 2019 who presents today for voiding trial after requiring Foley catheter replacement in the ED on 09/16/2022 after an equivocal voiding trial with [place] earlier that day.   Foley catheter removed in the morning, see separate procedure note for details.  He returned to clinic in the afternoon.  He reports drinking approximately 60 ounces of fluid.  He has been able to void multiple times without difficulty.  PVR 107 mL.  He remains on Flomax 0.8 mg daily.  He does not wish to pursue bladder outlet procedures due to concerns associated with the risks of anesthesia with his cardiac history.  PMH: Past Medical History:  Diagnosis Date   CAD S/P percutaneous coronary angioplasty 03/28/2014   PCI to proximal and mid LAD: mLAD - Xience Alpine DES 2.5 mm x 18 mm (3.0 mm), pLAD Xience Alpine DES 2.75 mm 18 mm (3.0 mm distal and 3.6 mm proximal.   Depression    Diabetes mellitus type II, controlled (HCC)    OA (osteoarthritis)    hip, back   Tobacco abuse    Unstable angina (HCC) 03/28/2014   DES x2 - p-mLAD    Surgical History: Past Surgical History:  Procedure Laterality Date   EXCISIONAL HEMORRHOIDECTOMY  1980's   INGUINAL HERNIA REPAIR Right ~ 2010   LEFT HEART CATHETERIZATION WITH CORONARY ANGIOGRAM N/A 03/28/2014   Procedure: LEFT HEART CATHETERIZATION WITH CORONARY ANGIOGRAM;  Surgeon: Marykay Lex, MD;  Location: [facility] CATH LAB;  Service: CV: pLAD 95% (after SP1 & D1), mLAD 75%, Cx becomes bifurcating OM -> inferior branch 60%, downward takeoff RCA with mid 40-50%.   PERCUTANEOUS CORONARY STENT INTERVENTION (PCI-S)  03/28/2014   Procedure: PERCUTANEOUS CORONARY STENT INTERVENTION (PCI-S);  Surgeon: Marykay Lex,  MD;  Location: [facility] CATH LAB;  Service: Cardiovascular;;  mLAD - Xience DES 2.5 mm x 18 mm (3.0 mm), pLAD Xience 2.75 mm x 18 mm (3.0 - 3.6 mm)    TRANSTHORACIC ECHOCARDIOGRAM  06/2014   EF 60-65% - no RWMA.  Gr 1 DD. Mild LA & RA dilation    Home Medications:  Allergies as of 10/07/2022   No Known Allergies      Medication List        Accurate as of October 07, 2022  3:14 PM. If you have any questions, ask your nurse or doctor.          lisinopril 2.5 MG tablet Commonly known as: ZESTRIL Take 1 tablet (2.5 mg total) by mouth daily.   metFORMIN 500 MG tablet Commonly known as: GLUCOPHAGE Take 1 tablet (500 mg total) by mouth 2 (two) times daily with a meal.   rosuvastatin 20 MG tablet Commonly known as: CRESTOR Take 1 tablet (20 mg total) by mouth daily.   tamsulosin 0.4 MG Caps capsule Commonly known as: FLOMAX Take 2 capsules (0.8 mg total) by mouth daily.        Allergies:  No Known Allergies  Family History: Family History  Problem Relation Age of Onset   Diabetes Sister    Heart disease Sister        died of MI   Breast cancer Sister    Stroke Sister  2 strokes    Breast cancer Sister    Heart disease Brother        multiple cardiac surgeries   Heart disease Sister        stent   Breast cancer Sister    Heart disease Mother    Heart disease Sister    Prostate cancer Neg Hx    Colon cancer Neg Hx     Social History:   reports that he quit smoking about 7 years ago. His smoking use included cigarettes. He has a 69.00 pack-year smoking history. He has been exposed to tobacco smoke. He has quit using smokeless tobacco. He reports that he does not drink alcohol and does not use drugs.  Physical Exam: There were no vitals taken for this visit.  Constitutional:  Alert and oriented, no acute distress, nontoxic appearing HEENT: [place], AT Cardiovascular: No clubbing, cyanosis, or edema Respiratory: Normal respiratory effort, no increased work of  breathing Skin: No rashes, bruises or suspicious lesions Neurologic: Grossly intact, no focal deficits, moving all 4 extremities Psychiatric: Normal mood and affect  Laboratory Data: Results for orders placed or performed in visit on 10/07/22  Bladder Scan (Post Void Residual) in office  Result Value Ref Range   Scan Result 107    Assessment & Plan:   1. Urine retention Voiding trial passed.  Will plan for symptom recheck and PVR in 4 weeks.  We did discuss that he would likely be a good candidate for prostate artery embolization, which would offer a less invasive procedural option to address his BPH.  He is open to consideration of this at our next visit depending on his symptoms/bladder scan. - Bladder Scan (Post Void Residual) in office  Return in about 4 weeks (around 11/04/2022) for Symptom recheck with PVR.  Carman Ching, PA-C  [facility] [address], [address] [place], [place] [address] [phone]

## 2022-10-07 NOTE — Progress Notes (Signed)
Catheter Removal  Patient is present today for a catheter removal.  8ml of water was drained from the balloon. A 16FR foley cath was removed from the bladder, no complications were noted. Patient tolerated well.  Performed by: Lashanda Storlie CMA  Follow up/ Additional notes: This afternoon   

## 2022-11-05 DIAGNOSIS — H5213 Myopia, bilateral: Secondary | ICD-10-CM | POA: Diagnosis not present

## 2022-11-06 ENCOUNTER — Ambulatory Visit: Payer: Medicare HMO | Admitting: Physician Assistant

## 2022-11-07 ENCOUNTER — Encounter: Payer: Self-pay | Admitting: Physician Assistant

## 2023-02-12 DIAGNOSIS — H2512 Age-related nuclear cataract, left eye: Secondary | ICD-10-CM | POA: Diagnosis not present

## 2023-02-13 DIAGNOSIS — H2511 Age-related nuclear cataract, right eye: Secondary | ICD-10-CM | POA: Diagnosis not present

## 2023-03-05 DIAGNOSIS — H2511 Age-related nuclear cataract, right eye: Secondary | ICD-10-CM | POA: Diagnosis not present

## 2023-11-10 ENCOUNTER — Emergency Department (HOSPITAL_COMMUNITY)
Admission: EM | Admit: 2023-11-10 | Discharge: 2023-11-10 | Disposition: A | Discharge status: Home / Self Care | Attending: Emergency Medicine | Admitting: Emergency Medicine

## 2023-11-10 ENCOUNTER — Other Ambulatory Visit: Payer: Self-pay

## 2023-11-10 DIAGNOSIS — R339 Retention of urine, unspecified: Secondary | ICD-10-CM | POA: Diagnosis not present

## 2023-11-10 LAB — URINALYSIS, ROUTINE W REFLEX MICROSCOPIC
Bilirubin Urine: NEGATIVE
Glucose, UA: NEGATIVE mg/dL
Ketones, ur: NEGATIVE mg/dL
Nitrite: NEGATIVE
Protein, ur: NEGATIVE mg/dL
RBC / HPF: >50 RBC/hpf (ref 0–5)
Specific Gravity, Urine: 1.003 — ABNORMAL LOW (ref 1.005–1.030)
WBC, UA: >50 WBC/hpf (ref 0–5)
pH: 6 (ref 5.0–8.0)

## 2023-11-10 MED ORDER — LIDOCAINE HCL URETHRAL/MUCOSAL 2 % EX GEL: 1.0000 | Freq: Once | CUTANEOUS | Status: DC

## 2023-11-10 MED ORDER — CEPHALEXIN 250 MG PO CAPS
500.0000 mg | ORAL_CAPSULE | Freq: Once | ORAL | Status: AC
  Administered 2023-11-10: 500 mg via ORAL
  Filled 2023-11-10: qty 2

## 2023-11-10 MED ORDER — CEPHALEXIN 500 MG PO CAPS: 500.0000 mg | ORAL_CAPSULE | Freq: Three times a day (TID) | ORAL | 0 refills | Status: DC

## 2023-11-10 NOTE — ED Provider Notes (Signed)
 [place] EMERGENCY DEPARTMENT AT [facility] Provider Note   CSN: [id number] Arrival date & time: 11/10/23  9690     Patient presents with: Urinary Retention   Tony Lynch is a 73 y.o. male.   Patient presents to the emergency department for evaluation of urinary retention.  Patient reports that he woke up at 10 PM and felt like he needed to urinate.  He reports that he was only able to dribble a little bit.  Since then he has not been able to pass any urine.  Patient progressively becoming more uncomfortable.  He reports a history of similar urinary retention requiring catheterization in the past.       Prior to Admission medications   Medication Sig Start Date End Date Taking? Authorizing Provider  cephALEXin  (KEFLEX ) 500 MG capsule Take 1 capsule (500 mg total) by mouth 3 (three) times daily. 11/10/23  Yes Karle Desrosier, Lonni PARAS, MD  lisinopril  (ZESTRIL ) 2.5 MG tablet Take 1 tablet (2.5 mg total) by mouth daily. 05/07/20   Velma Raisin, MD  metFORMIN  (GLUCOPHAGE ) 500 MG tablet Take 1 tablet (500 mg total) by mouth 2 (two) times daily with a meal. 05/07/20   Velma Raisin, MD  rosuvastatin  (CRESTOR ) 20 MG tablet Take 1 tablet (20 mg total) by mouth daily. 06/26/20   Velma Raisin, MD  tamsulosin  (FLOMAX ) 0.4 MG CAPS capsule Take 2 capsules (0.8 mg total) by mouth daily. 09/16/22   Helon Kirsch A, PA-C    Allergies: Patient has no known allergies.    Review of Systems  Updated Vital Signs BP (!) 141/111 (BP Location: Right Arm)   Pulse (!) 153   Temp 97.7 F (36.5 C)   Resp 18   SpO2 94%   Physical Exam Vitals and nursing note reviewed.  Constitutional:      General: He is not in acute distress.    Appearance: He is well-developed.  HENT:     Head: Normocephalic and atraumatic.     Mouth/Throat:     Mouth: Mucous membranes are moist.  Eyes:     General: Vision grossly intact. Gaze aligned appropriately.     Extraocular Movements: Extraocular movements  intact.     Conjunctiva/sclera: Conjunctivae normal.  Cardiovascular:     Rate and Rhythm: Normal rate and regular rhythm.     Pulses: Normal pulses.     Heart sounds: Normal heart sounds, S1 normal and S2 normal. No murmur heard.    No friction rub. No gallop.  Pulmonary:     Effort: Pulmonary effort is normal. No respiratory distress.     Breath sounds: Normal breath sounds.  Abdominal:     Palpations: Abdomen is soft.     Tenderness: There is abdominal tenderness in the suprapubic area. There is no guarding or rebound.     Hernia: No hernia is present.  Musculoskeletal:        General: No swelling.     Cervical back: Full passive range of motion without pain, normal range of motion and neck supple. No pain with movement, spinous process tenderness or muscular tenderness. Normal range of motion.     Right lower leg: No edema.     Left lower leg: No edema.  Skin:    General: Skin is warm and dry.     Capillary Refill: Capillary refill takes less than 2 seconds.     Findings: No ecchymosis, erythema, lesion or wound.  Neurological:     Mental Status: He is alert  and oriented to person, place, and time.     GCS: GCS eye subscore is 4. GCS verbal subscore is 5. GCS motor subscore is 6.     Cranial Nerves: Cranial nerves 2-12 are intact.     Sensory: Sensation is intact.     Motor: Motor function is intact. No weakness or abnormal muscle tone.     Coordination: Coordination is intact.  Psychiatric:        Mood and Affect: Mood normal.        Speech: Speech normal.        Behavior: Behavior normal.     (all labs ordered are listed, but only abnormal results are displayed) Labs Reviewed  URINALYSIS, ROUTINE W REFLEX MICROSCOPIC - Abnormal; Notable for the following components:      Result Value   APPearance HAZY (*)    Specific Gravity, Urine 1.003 (*)    Hgb urine dipstick LARGE (*)    Leukocytes,Ua LARGE (*)    Bacteria, UA RARE (*)    All other components within normal  limits  URINE CULTURE    EKG: None  Radiology: No results found.   Procedures   Medications Ordered in the ED  lidocaine  (XYLOCAINE ) 2 % jelly 1 Application (1 Application Topical Not Given 11/10/23 0413)  cephALEXin  (KEFLEX ) capsule 500 mg (has no administration in time range)                                    Medical Decision Making Amount and/or Complexity of Data Reviewed Labs: ordered.  Risk Prescription drug management.   Presents with acute urinary retention.  Patient has had similar problems in the past.  Patient had a Foley catheter placed and his discomfort resolved.  Urinalysis with greater than 50 white cells and greater than 50 red cells.  Will cover for possible infection, culture urine.  Discharged with leg bag, follow-up with urology in the office.  Given return precautions.     Final diagnoses:  Urinary retention    ED Discharge Orders          Ordered    cephALEXin  (KEFLEX ) 500 MG capsule  3 times daily        11/10/23 0430               Haze Lonni PARAS, MD 11/10/23 [phone]

## 2023-11-10 NOTE — ED Triage Notes (Addendum)
 Patient reports urinary retention ,last voided yesterday . HR=153 at arrival.

## 2023-11-12 LAB — URINE CULTURE: Culture: >=100000 — AB

## 2023-11-13 ENCOUNTER — Telehealth (HOSPITAL_BASED_OUTPATIENT_CLINIC_OR_DEPARTMENT_OTHER): Payer: Self-pay

## 2023-11-13 NOTE — Telephone Encounter (Signed)
 Post ED Visit - Positive Culture Follow-up  Culture report reviewed by antimicrobial stewardship pharmacist: [facility] [x]  [place], [place].D. []  Venetia Gully, Pharm.D., BCPS AQ-ID []  Garrel Crews, Pharm.D., BCPS []  Almarie Lunger, Pharm.D., BCPS []  [place], [place].D., BCPS, AAHIVP []  Rosaline Bihari, Pharm.D., BCPS, AAHIVP []  Vernell Meier, PharmD, BCPS []  Latanya Hint, PharmD, BCPS []  Donald Medley, PharmD, BCPS []  Rocky Bold, PharmD []  Dorothyann Alert, PharmD, BCPS []  Morene Babe, PharmD  [facility] []  Rosaline Edison, PharmD []  Romona Bliss, PharmD []  Dolphus Roller, PharmD []  Veva Seip, Rph []  Vernell Daunt) Leonce, PharmD []  Eva Allis, PharmD []  Rosaline Millet, PharmD []  Iantha Batch, PharmD []  Arvin Gauss, PharmD []  Wanda Hasting, PharmD []  Ronal Rav, PharmD []  Rocky Slade, PharmD []  Bard Jeans, PharmD   Positive urine culture Treated with Cephalexin , organism sensitive to the same. Presents with urinary retention and foley placed. No other urinary s/s noted. UA noted to have epithelial cells.  no further patient follow-up is required at this time.  Ruth Camelia Elbe 11/13/2023, 12:52 PM

## 2023-11-30 ENCOUNTER — Telehealth: Payer: Self-pay

## 2023-11-30 NOTE — Telephone Encounter (Signed)
 Called patient back and discussed issues with leaking catheter. Explained leaking around cath could be normal but he could use a depends to help in the mean time. Let patien tknow he could come in to get more bags if needed. Went ahead and set him up with a voiding trial with Sam Pa on 8/21 to remove cath.

## 2023-12-17 ENCOUNTER — Ambulatory Visit: Admitting: Physician Assistant

## 2023-12-17 ENCOUNTER — Emergency Department (HOSPITAL_COMMUNITY)
Admission: EM | Admit: 2023-12-17 | Discharge: 2023-12-18 | Discharge status: Against Medical Advice | Attending: Emergency Medicine | Admitting: Emergency Medicine

## 2023-12-17 ENCOUNTER — Other Ambulatory Visit: Payer: Self-pay

## 2023-12-17 ENCOUNTER — Encounter: Payer: Self-pay | Admitting: Physician Assistant

## 2023-12-17 VITALS — BP 110/70 | HR 166 | Ht 73.0 in | Wt 140.0 lb

## 2023-12-17 DIAGNOSIS — R339 Retention of urine, unspecified: Secondary | ICD-10-CM | POA: Insufficient documentation

## 2023-12-17 DIAGNOSIS — N138 Other obstructive and reflux uropathy: Secondary | ICD-10-CM | POA: Diagnosis not present

## 2023-12-17 DIAGNOSIS — N401 Enlarged prostate with lower urinary tract symptoms: Secondary | ICD-10-CM

## 2023-12-17 DIAGNOSIS — R102 Pelvic and perineal pain: Secondary | ICD-10-CM | POA: Diagnosis not present

## 2023-12-17 DIAGNOSIS — Z5321 Procedure and treatment not carried out due to patient leaving prior to being seen by health care provider: Secondary | ICD-10-CM | POA: Diagnosis not present

## 2023-12-17 LAB — CBC WITH DIFFERENTIAL/PLATELET
Abs Immature Granulocytes: 0.02 K/uL (ref 0.00–0.07)
Basophils Absolute: 0.1 K/uL (ref 0.0–0.1)
Basophils Relative: 1 %
Eosinophils Absolute: 0.1 K/uL (ref 0.0–0.5)
Eosinophils Relative: 1 %
HCT: 40.4 % (ref 39.0–52.0)
Hemoglobin: 13.3 g/dL (ref 13.0–17.0)
Immature Granulocytes: 0 %
Lymphocytes Relative: 25 %
Lymphs Abs: 2.4 K/uL (ref 0.7–4.0)
MCH: 28.9 pg (ref 26.0–34.0)
MCHC: 32.9 g/dL (ref 30.0–36.0)
MCV: 87.8 fL (ref 80.0–100.0)
Monocytes Absolute: 0.9 K/uL (ref 0.1–1.0)
Monocytes Relative: 9 %
Neutro Abs: 6.3 K/uL (ref 1.7–7.7)
Neutrophils Relative %: 64 %
Platelets: 287 K/uL (ref 150–400)
RBC: 4.6 MIL/uL (ref 4.22–5.81)
RDW: 15 % (ref 11.5–15.5)
WBC: 9.8 K/uL (ref 4.0–10.5)
nRBC: 0 % (ref 0.0–0.2)

## 2023-12-17 LAB — COMPREHENSIVE METABOLIC PANEL WITH GFR
ALT: 9 U/L (ref 0–44)
AST: 19 U/L (ref 15–41)
Albumin: 3.6 g/dL (ref 3.5–5.0)
Alkaline Phosphatase: 49 U/L (ref 38–126)
Anion gap: 10 (ref 5–15)
BUN: 6 mg/dL — ABNORMAL LOW (ref 8–23)
CO2: 25 mmol/L (ref 22–32)
Calcium: 9 mg/dL (ref 8.9–10.3)
Chloride: 99 mmol/L (ref 98–111)
Creatinine, Ser: 0.87 mg/dL (ref 0.61–1.24)
GFR, Estimated: >60 mL/min (ref >60)
Glucose, Bld: 159 mg/dL — ABNORMAL HIGH (ref 70–99)
Potassium: 3.9 mmol/L (ref 3.5–5.1)
Sodium: 134 mmol/L — ABNORMAL LOW (ref 135–145)
Total Bilirubin: 0.5 mg/dL (ref 0.0–1.2)
Total Protein: 6.3 g/dL — ABNORMAL LOW (ref 6.5–8.1)

## 2023-12-17 LAB — URINALYSIS, ROUTINE W REFLEX MICROSCOPIC
Bilirubin Urine: NEGATIVE
Glucose, UA: NEGATIVE mg/dL
Ketones, ur: NEGATIVE mg/dL
Nitrite: NEGATIVE
Protein, ur: NEGATIVE mg/dL
Specific Gravity, Urine: 1.001 — ABNORMAL LOW (ref 1.005–1.030)
pH: 6 (ref 5.0–8.0)

## 2023-12-17 LAB — BLADDER SCAN AMB NON-IMAGING

## 2023-12-17 MED ORDER — OXYCODONE-ACETAMINOPHEN 5-325 MG PO TABS: 1.0000 | ORAL_TABLET | Freq: Once | ORAL | Status: DC

## 2023-12-17 MED ORDER — TAMSULOSIN HCL 0.4 MG PO CAPS
0.8000 mg | ORAL_CAPSULE | Freq: Every day | ORAL | 3 refills | Status: AC
Start: 2023-12-17 — End: ?

## 2023-12-17 MED ORDER — FINASTERIDE 5 MG PO TABS
5.0000 mg | ORAL_TABLET | Freq: Every day | ORAL | 3 refills | Status: AC
Start: 2023-12-17 — End: ?

## 2023-12-17 MED ORDER — SULFAMETHOXAZOLE-TRIMETHOPRIM 800-160 MG PO TABS
1.0000 | ORAL_TABLET | Freq: Two times a day (BID) | ORAL | 0 refills | Status: AC
Start: 2023-12-17 — End: 2023-12-20

## 2023-12-17 NOTE — ED Triage Notes (Signed)
 Pt had foley taken out this morning. Was voiding at first and has not voided since 1630. Pt having severe pelvic pain and pressure.

## 2023-12-17 NOTE — Progress Notes (Signed)
 12/17/2023 1:15 PM   Tony Lynch Agent 1950-08-27 [id number]  CC: Chief Complaint  Patient presents with   Urinary Retention   HPI: Tony Lynch is a 73 y.o. male with PMH diabetes, CAD, BPH with recurrent urinary retention on Flomax  0.8 mg daily, and elevated PSA with benign biopsy in 2019 who presents today for voiding trial.   He was seen in the ED on 11/10/2023 with reports of the inability to void x 1 day.  Foley placed, volume drained unclear.  UA was positive and he was sent home on Keflex .  Urine culture finalized with ampicillin resistant Klebsiella oxytoca.  Catheter removed in the morning, see separate note.  He returned for PM PVR.  He has voided twice, the second time with no difficulty, and is feeling well.SABRA PVR .  PMH: Past Medical History:  Diagnosis Date   CAD S/P percutaneous coronary angioplasty 03/28/2014   PCI to proximal and mid LAD: mLAD - Xience Alpine DES 2.5 mm x 18 mm (3.0 mm), pLAD Xience Alpine DES 2.75 mm 18 mm (3.0 mm distal and 3.6 mm proximal.   Depression    Diabetes mellitus type II, controlled (HCC)    OA (osteoarthritis)    hip, back   Tobacco abuse    Unstable angina (HCC) 03/28/2014   DES x2 - p-mLAD    Surgical History: Past Surgical History:  Procedure Laterality Date   EXCISIONAL HEMORRHOIDECTOMY  1980's   INGUINAL HERNIA REPAIR Right ~ 2010   LEFT HEART CATHETERIZATION WITH CORONARY ANGIOGRAM N/A 03/28/2014   Procedure: LEFT HEART CATHETERIZATION WITH CORONARY ANGIOGRAM;  Surgeon: Alm LELON Clay, MD;  Location: [facility] CATH LAB;  Service: CV: pLAD 95% (after SP1 & D1), mLAD 75%, Cx becomes bifurcating OM -> inferior branch 60%, downward takeoff RCA with mid 40-50%.   PERCUTANEOUS CORONARY STENT INTERVENTION (PCI-S)  03/28/2014   Procedure: PERCUTANEOUS CORONARY STENT INTERVENTION (PCI-S);  Surgeon: Alm LELON Clay, MD;  Location: [facility] CATH LAB;  Service: Cardiovascular;;  mLAD - Xience DES 2.5 mm x 18 mm (3.0 mm), pLAD Xience 2.75 mm x  18 mm (3.0 - 3.6 mm)    TRANSTHORACIC ECHOCARDIOGRAM  06/2014   EF 60-65% - no RWMA.  Gr 1 DD. Mild LA & RA dilation    Home Medications:  Allergies as of 12/17/2023   No Known Allergies      Medication List        Accurate as of December 17, 2023  1:15 PM. If you have any questions, ask your nurse or doctor.          STOP taking these medications    cephALEXin  500 MG capsule Commonly known as: KEFLEX  Stopped by: Lucie Hones   lisinopril  2.5 MG tablet Commonly known as: ZESTRIL  Stopped by: Lucie Hones   metFORMIN  500 MG tablet Commonly known as: GLUCOPHAGE  Stopped by: Lucie Hones   rosuvastatin  20 MG tablet Commonly known as: CRESTOR  Stopped by: Lucie Hones       TAKE these medications    tamsulosin  0.4 MG Caps capsule Commonly known as: FLOMAX  Take 2 capsules (0.8 mg total) by mouth daily.        Allergies:  No Known Allergies  Family History: Family History  Problem Relation Age of Onset   Diabetes Sister    Heart disease Sister        died of MI   Breast cancer Sister    Stroke Sister        2 strokes  Breast cancer Sister    Heart disease Brother        multiple cardiac surgeries   Heart disease Sister        stent   Breast cancer Sister    Heart disease Mother    Heart disease Sister    Prostate cancer Neg Hx    Colon cancer Neg Hx     Social History:   reports that he quit smoking about 8 years ago. His smoking use included cigarettes. He started smoking about 54 years ago. He has a 69 pack-year smoking history. He has been exposed to tobacco smoke. He has quit using smokeless tobacco. He reports that he does not drink alcohol and does not use drugs.  Physical Exam: BP 110/70 (BP Location: Left Arm, Patient Position: Sitting, Cuff Size: Normal)   Pulse (!) 166   Ht 6' 1 (1.854 m)   Wt 140 lb (63.5 kg)   BMI 18.47 kg/m   Constitutional:  Alert and oriented, no acute distress, nontoxic  appearing HEENT: Tony Lynch, AT Cardiovascular: No clubbing, cyanosis, or edema Respiratory: Normal respiratory effort, no increased work of breathing Skin: No rashes, bruises or suspicious lesions Neurologic: Grossly intact, no focal deficits, moving all 4 extremities Psychiatric: Normal mood and affect  Laboratory Data: Results for orders placed or performed in visit on 12/17/23  BLADDER SCAN AMB NON-IMAGING   Collection Time: 12/17/23  3:45 PM  Result Value Ref Range   Scan Result    Assessment & Plan:   1. Benign prostatic hyperplasia with urinary obstruction (Primary) Voiding trial passed.  This is his first episode of retention in about a year and he previously deferred outlet procedures.  He continues to decline these today.  I recommended 3 days of Bactrim  to sterilize the urine in the setting of recent Foley catheter.  He requests a refill of Flomax  0.8 mg daily.  I also recommended starting finasteride  to reduce his risk for recurrent retention in the future, especially if he is opposed to outlet procedures and he agreed. - BLADDER SCAN AMB NON-IMAGING - finasteride  (PROSCAR ) 5 MG tablet; Take 1 tablet (5 mg total) by mouth daily.  Dispense: 90 tablet; Refill: 3 - sulfamethoxazole -trimethoprim  (BACTRIM  DS) 800-160 MG tablet; Take 1 tablet by mouth 2 (two) times daily for 3 days.  Dispense: 6 tablet; Refill: 0 - tamsulosin  (FLOMAX ) 0.4 MG CAPS capsule; Take 2 capsules (0.8 mg total) by mouth daily.  Dispense: 180 capsule; Refill: 3   Return in about 4 weeks (around 01/14/2024) for IPSS, PVR.  Lucie Hones, PA-C  [facility] [place] [address], [address] [place], [place] [address] [phone]

## 2023-12-17 NOTE — Progress Notes (Signed)
 Catheter Removal  Patient is present today for a catheter removal.  10ml of water was drained from the balloon. A 16FR foley cath was removed from the bladder, no complications were noted. Patient tolerated well.  Performed by: Beauford Clydell VENESSA Myrtha Dante CMA.  Follow up/ Additional notes: Instructions are to return this afternoon for a bladder scan.

## 2023-12-18 ENCOUNTER — Telehealth: Payer: Self-pay

## 2023-12-18 NOTE — Telephone Encounter (Signed)
 Pt called in to let us  know that after he went home from his clinic voiding trial he had to go back to the ER and have Cath replaced. Pt then come in this morning to get a leg bag and supplies. I then asked if pt would like to try again for a voiding trial or continue with cath changes for 3 months to let new medication work. Pt opted to just do the 3 months of cath changes and he already has an appt in Sept. Pt voiced understanding and left office with supplies.

## 2023-12-19 LAB — URINE CULTURE: Culture: >=100000 — AB

## 2023-12-20 ENCOUNTER — Telehealth (HOSPITAL_BASED_OUTPATIENT_CLINIC_OR_DEPARTMENT_OTHER): Payer: Self-pay | Admitting: *Deleted

## 2023-12-20 NOTE — Telephone Encounter (Signed)
 Post ED Visit - Positive Culture Follow-up  Culture report reviewed by antimicrobial stewardship pharmacist: [facility] [x]  Dorn Poot, Pharm.D. []  Venetia Gully, Pharm.D., BCPS AQ-ID []  Garrel Crews, Pharm.D., BCPS []  Almarie Lunger, Pharm.D., BCPS []  [place], [place].D., BCPS, AAHIVP []  Rosaline Bihari, Pharm.D., BCPS, AAHIVP []  Vernell Meier, PharmD, BCPS []  Latanya Hint, PharmD, BCPS []  Donald Medley, PharmD, BCPS []  Rocky Bold, PharmD []  Dorothyann Alert, PharmD, BCPS []  Morene Babe, PharmD  [facility] []  Rosaline Edison, PharmD []  Romona Bliss, PharmD []  Dolphus Roller, PharmD []  Veva Seip, Rph []  Vernell Daunt) Leonce, PharmD []  Eva Allis, PharmD []  Rosaline Millet, PharmD []  Iantha Batch, PharmD []  Arvin Gauss, PharmD []  Wanda Hasting, PharmD []  Ronal Rav, PharmD []  Rocky Slade, PharmD []  Bard Jeans, PharmD   Positive urine culture Patient seen by urology and given Bactrim  on 8/21 for treatment and no further patient follow-up is required at this time.  Tony Lynch 12/20/2023, 4:23 PM

## 2024-01-14 ENCOUNTER — Ambulatory Visit: Admitting: Physician Assistant

## 2024-01-14 VITALS — BP 143/89 | HR 71

## 2024-01-14 DIAGNOSIS — N138 Other obstructive and reflux uropathy: Secondary | ICD-10-CM

## 2024-01-14 DIAGNOSIS — N401 Enlarged prostate with lower urinary tract symptoms: Secondary | ICD-10-CM

## 2024-01-14 LAB — BLADDER SCAN AMB NON-IMAGING

## 2024-01-14 NOTE — Progress Notes (Signed)
 Cath Change/ Replacement  Patient is present today for a catheter change due to urinary retention.  8ml of water was removed from the balloon, a 16FR foley cath was removed without difficulty.  Patient was cleaned and prepped in a sterile fashion with betadine and 2% lidocaine  jelly was instilled into the urethra. A 16 FR foley cath was replaced into the bladder, no complications were noted. Urine return was noted 30ml and urine was yellow in color. The balloon was filled with 10ml of sterile water. A leg bag was attached for drainage.  Patient tolerated well.    Performed by: Caitlyn Buchanan, PA-C   Additional notes: Will plan for monthly Foley changes with voiding trial in November.  Follow up: Return in about 4 weeks (around 02/11/2024) for Catheter exchange.

## 2024-01-25 NOTE — Progress Notes (Signed)
 Tony Lynch                                          MRN: [id number]   01/25/2024   The [facility] Quality Team Specialist reviewed this patient medical record for the purposes of chart review for care gap closure. The following were reviewed: chart review for care gap closure-glycemic status assessment and kidney health evaluation for diabetes:eGFR  and uACR.    [facility] Quality Team

## 2024-02-15 ENCOUNTER — Ambulatory Visit: Admitting: Physician Assistant

## 2024-02-15 DIAGNOSIS — N138 Other obstructive and reflux uropathy: Secondary | ICD-10-CM | POA: Diagnosis not present

## 2024-02-15 DIAGNOSIS — N401 Enlarged prostate with lower urinary tract symptoms: Secondary | ICD-10-CM | POA: Diagnosis not present

## 2024-02-15 DIAGNOSIS — R972 Elevated prostate specific antigen [PSA]: Secondary | ICD-10-CM

## 2024-02-15 NOTE — Progress Notes (Signed)
 Cath Change/ Replacement  Patient is present today for a catheter change due to urinary retention.  8ml of water was removed from the balloon, a 16FR foley cath was removed without difficulty.  Patient was cleaned and prepped in a sterile fashion with betadine and 2% lidocaine  jelly was instilled into the urethra. A 16 FR foley cath was replaced into the bladder, no complications were noted. Urine return was noted 3ml and urine was yellow in color. The balloon was filled with 10ml of sterile water. A leg bag was attached for drainage.    Performed by: Lucie Hones, PA-C   Additional notes: PMH elevated PSA with benign biopsy in 2019. We discussed that urinary retention can be a manifestation of advanced prostate cancer. With indwelling Foley, he'll be at elevated risk for infection with repeat biopsy. I recommended MR prostate as an alternative and he agreed.  Follow up: Return in about 4 weeks (around 03/14/2024) for Catheter exchange.

## 2024-03-07 ENCOUNTER — Other Ambulatory Visit

## 2024-03-08 ENCOUNTER — Ambulatory Visit

## 2024-03-15 NOTE — Progress Notes (Unsigned)
 Cath Change/ Replacement  Patient is present today for a catheter change due to urinary retention.  10 ml of water was removed from the balloon, a 16 FR foley cath was removed without difficulty.  Patient was cleaned and prepped in a sterile fashion with betadine and 2% lidocaine  jelly was instilled into the urethra. A 16 FR foley cath was replaced into the bladder, no complications were noted. Urine return was noted 5 ml and urine was yellow in color. The balloon was filled with 10ml of sterile water. A Leg bag was attached for drainage. Patient was given instruction on how to change from one bag to another. Patient was given proper instruction on catheter care.    Performed by: Mathew Pinal, RN  Follow up: One month cath change on 04/18/24

## 2024-03-16 ENCOUNTER — Ambulatory Visit: Admitting: Physician Assistant

## 2024-03-16 DIAGNOSIS — R339 Retention of urine, unspecified: Secondary | ICD-10-CM | POA: Diagnosis not present

## 2024-04-18 ENCOUNTER — Ambulatory Visit (INDEPENDENT_AMBULATORY_CARE_PROVIDER_SITE_OTHER): Admitting: Urology

## 2024-04-18 DIAGNOSIS — R339 Retention of urine, unspecified: Secondary | ICD-10-CM

## 2024-04-18 NOTE — Progress Notes (Signed)
 Cath Change/ Replacement   Patient is present today for a catheter change due to urinary retention.  10 ml of water was removed from the balloon, a 16 FR foley cath was removed without difficulty.  Patient was cleaned and prepped in a sterile fashion with betadine and 2% lidocaine  jelly was instilled into the urethra. A 16 FR foley cath was replaced into the bladder, no complications were noted. Urine return was noted 5 ml and urine was yellow in color. The balloon was filled with 10ml of sterile water. A Leg bag was attached for drainage. Patient was given instruction on how to change from one bag to another. Patient was given proper instruction on catheter care.     Performed by: Harlene Franks CMA    Follow up: One month cath change

## 2024-04-26 ENCOUNTER — Telehealth: Payer: Self-pay

## 2024-04-26 NOTE — Telephone Encounter (Signed)
 Pt called in with complaints of leaking around catheter. I explained to patient that this might be bladder spasms from just recently having his catheter change. If happen right after his change. He states it has not happened over the last 3 days though. I told patient that if it continues today and tomorrow to give a call tomorrow before we close and we can give him an appt to add him to the nurse schedule on Friday and that we can use the regular latex catheter now that we got them back in stock

## 2024-05-11 ENCOUNTER — Telehealth: Payer: Self-pay

## 2024-05-11 NOTE — Telephone Encounter (Signed)
 Pt called with c/o leaking catheter. We discussed bring him in tomorrow for a week earlier cath change to see if this resolves the issue. We will use the 16 fr latex instead of the silastic catheter.

## 2024-05-12 ENCOUNTER — Ambulatory Visit: Admitting: Physician Assistant

## 2024-05-12 DIAGNOSIS — Z466 Encounter for fitting and adjustment of urinary device: Secondary | ICD-10-CM | POA: Diagnosis not present

## 2024-05-12 NOTE — Progress Notes (Signed)
 Tony Lynch                                          MRN: [id number]   05/12/2024   The [facility] Quality Team Specialist reviewed this patient medical record for the purposes of chart review for care gap closure. The following were reviewed: chart review for care gap closure-glycemic status assessment.    [facility] Quality Team

## 2024-05-12 NOTE — Progress Notes (Signed)
 Cath Change/ Replacement  Patient is present today for a catheter change due to urinary retention. Patient has had 2 weeks of consistent leaking around catheter. We discussed this could possibly be bladder spasms but patient feels its the different catheter. We are going to go back to the latex catheter today as they are back in stock. I instructed patient to let us  know if he continues to have leaking and I will discuss with the PA a solution which may include up sizing catheter. After our discussion we replaced catheter.  9 ml of water was removed from the balloon, a 16 FR silastic foley cath was removed without difficulty.  Patient was cleaned and prepped in a sterile fashion with betadine and 2% lidocaine  jelly was instilled into the urethra. A 16 FR latex foley cath was replaced into the bladder, no complications were noted. Urine return was noted 10 ml and urine was yellow in color. The balloon was filled with 10ml of sterile water. A leg bag was attached for drainage.    Performed by: Mathew Pinal, RN  Follow up: 1 month cath change

## 2024-05-18 NOTE — Progress Notes (Signed)
 Tony Lynch                                          MRN: [id number]   05/18/2024   The [facility] Quality Team Specialist reviewed this patient medical record for the purposes of chart review for care gap closure. The following were reviewed: chart review for care gap closure-cervical cancer screening and kidney health evaluation for diabetes:eGFR  and uACR.    [facility] Quality Team

## 2024-05-19 ENCOUNTER — Ambulatory Visit

## 2024-06-09 ENCOUNTER — Ambulatory Visit: Admitting: Physician Assistant

## 2024-06-09 ENCOUNTER — Ambulatory Visit

## 2024-06-10 ENCOUNTER — Ambulatory Visit
